# Patient Record
Sex: Male | Born: 1940 | Race: White | Hispanic: No | Marital: Married | Smoking: Former smoker
Health system: Southern US, Community
[De-identification: ages and names within clinical notes are randomized; demographics above are authoritative.]

## PROBLEM LIST (undated history)

## (undated) DIAGNOSIS — D509 Iron deficiency anemia, unspecified: Secondary | ICD-10-CM

## (undated) DIAGNOSIS — I4891 Unspecified atrial fibrillation: Secondary | ICD-10-CM

## (undated) DIAGNOSIS — Q249 Congenital malformation of heart, unspecified: Secondary | ICD-10-CM

## (undated) DIAGNOSIS — E119 Type 2 diabetes mellitus without complications: Secondary | ICD-10-CM

## (undated) DIAGNOSIS — K219 Gastro-esophageal reflux disease without esophagitis: Secondary | ICD-10-CM

## (undated) DIAGNOSIS — I10 Essential (primary) hypertension: Secondary | ICD-10-CM

## (undated) DIAGNOSIS — G4733 Obstructive sleep apnea (adult) (pediatric): Secondary | ICD-10-CM

## (undated) DIAGNOSIS — E785 Hyperlipidemia, unspecified: Secondary | ICD-10-CM

## (undated) DIAGNOSIS — I2510 Atherosclerotic heart disease of native coronary artery without angina pectoris: Secondary | ICD-10-CM

## (undated) DIAGNOSIS — N200 Calculus of kidney: Secondary | ICD-10-CM

## (undated) DIAGNOSIS — I059 Rheumatic mitral valve disease, unspecified: Secondary | ICD-10-CM

## (undated) HISTORY — DX: Atherosclerotic heart disease of native coronary artery without angina pectoris: I25.10

## (undated) HISTORY — DX: Calculus of kidney: N20.0

## (undated) HISTORY — DX: Congenital malformation of heart, unspecified: Q24.9

## (undated) HISTORY — PX: MANDIBLE SURGERY: SHX707

## (undated) HISTORY — DX: Essential (primary) hypertension: I10

## (undated) HISTORY — DX: Gastro-esophageal reflux disease without esophagitis: K21.9

## (undated) HISTORY — DX: Obstructive sleep apnea (adult) (pediatric): G47.33

## (undated) HISTORY — DX: Rheumatic mitral valve disease, unspecified: I05.9

## (undated) HISTORY — DX: Unspecified atrial fibrillation: I48.91

## (undated) HISTORY — DX: Hyperlipidemia, unspecified: E78.5

## (undated) HISTORY — DX: Type 2 diabetes mellitus without complications: E11.9

## (undated) HISTORY — DX: Iron deficiency anemia, unspecified: D50.9

## (undated) HISTORY — PX: ANGIOPLASTY: SHX39

---

## 2000-11-27 ENCOUNTER — Inpatient Hospital Stay (HOSPITAL_COMMUNITY): Admission: EM | Admit: 2000-11-27 | Discharge: 2000-12-03 | Payer: Self-pay | Admitting: Internal Medicine

## 2002-06-29 ENCOUNTER — Encounter: Admission: RE | Admit: 2002-06-29 | Discharge: 2002-06-29 | Payer: Self-pay | Admitting: Cardiology

## 2002-06-29 ENCOUNTER — Encounter: Payer: Self-pay | Admitting: Cardiology

## 2002-07-12 ENCOUNTER — Ambulatory Visit (HOSPITAL_COMMUNITY): Admission: RE | Admit: 2002-07-12 | Discharge: 2002-07-12 | Payer: Self-pay | Admitting: Cardiology

## 2002-09-17 ENCOUNTER — Ambulatory Visit (HOSPITAL_COMMUNITY): Admission: RE | Admit: 2002-09-17 | Discharge: 2002-09-18 | Payer: Self-pay | Admitting: Cardiology

## 2003-01-31 ENCOUNTER — Encounter: Admission: RE | Admit: 2003-01-31 | Discharge: 2003-05-01 | Payer: Self-pay | Admitting: Family Medicine

## 2006-10-27 ENCOUNTER — Encounter: Payer: Self-pay | Admitting: Cardiology

## 2007-04-15 ENCOUNTER — Encounter: Payer: Self-pay | Admitting: Cardiology

## 2008-06-24 ENCOUNTER — Encounter: Payer: Self-pay | Admitting: Cardiology

## 2009-01-15 ENCOUNTER — Encounter: Payer: Self-pay | Admitting: Pulmonary Disease

## 2009-04-12 ENCOUNTER — Encounter: Payer: Self-pay | Admitting: Pulmonary Disease

## 2009-05-05 ENCOUNTER — Encounter: Payer: Self-pay | Admitting: Cardiology

## 2009-10-09 ENCOUNTER — Telehealth (INDEPENDENT_AMBULATORY_CARE_PROVIDER_SITE_OTHER): Payer: Self-pay | Admitting: *Deleted

## 2009-10-11 DIAGNOSIS — D509 Iron deficiency anemia, unspecified: Secondary | ICD-10-CM

## 2009-10-11 DIAGNOSIS — I4891 Unspecified atrial fibrillation: Secondary | ICD-10-CM

## 2009-10-11 DIAGNOSIS — E785 Hyperlipidemia, unspecified: Secondary | ICD-10-CM

## 2009-10-11 DIAGNOSIS — I059 Rheumatic mitral valve disease, unspecified: Secondary | ICD-10-CM

## 2009-10-11 DIAGNOSIS — K219 Gastro-esophageal reflux disease without esophagitis: Secondary | ICD-10-CM | POA: Insufficient documentation

## 2009-10-11 DIAGNOSIS — R55 Syncope and collapse: Secondary | ICD-10-CM | POA: Insufficient documentation

## 2009-10-11 HISTORY — DX: Unspecified atrial fibrillation: I48.91

## 2009-10-11 HISTORY — DX: Iron deficiency anemia, unspecified: D50.9

## 2009-10-11 HISTORY — DX: Hyperlipidemia, unspecified: E78.5

## 2009-10-11 HISTORY — DX: Rheumatic mitral valve disease, unspecified: I05.9

## 2009-10-11 HISTORY — DX: Gastro-esophageal reflux disease without esophagitis: K21.9

## 2009-10-12 ENCOUNTER — Ambulatory Visit: Payer: Self-pay | Admitting: Cardiology

## 2009-10-12 DIAGNOSIS — I2510 Atherosclerotic heart disease of native coronary artery without angina pectoris: Secondary | ICD-10-CM

## 2009-10-12 DIAGNOSIS — I10 Essential (primary) hypertension: Secondary | ICD-10-CM

## 2009-10-12 DIAGNOSIS — R0989 Other specified symptoms and signs involving the circulatory and respiratory systems: Secondary | ICD-10-CM

## 2009-10-12 HISTORY — DX: Atherosclerotic heart disease of native coronary artery without angina pectoris: I25.10

## 2009-10-20 ENCOUNTER — Ambulatory Visit: Payer: Self-pay | Admitting: Pulmonary Disease

## 2009-10-20 ENCOUNTER — Ambulatory Visit: Payer: Self-pay | Admitting: Cardiology

## 2009-10-20 ENCOUNTER — Ambulatory Visit: Payer: Self-pay

## 2009-10-20 DIAGNOSIS — E119 Type 2 diabetes mellitus without complications: Secondary | ICD-10-CM

## 2009-10-20 DIAGNOSIS — I10 Essential (primary) hypertension: Secondary | ICD-10-CM

## 2009-10-20 DIAGNOSIS — G4733 Obstructive sleep apnea (adult) (pediatric): Secondary | ICD-10-CM

## 2009-10-20 HISTORY — DX: Essential (primary) hypertension: I10

## 2009-10-20 HISTORY — DX: Obstructive sleep apnea (adult) (pediatric): G47.33

## 2009-10-20 HISTORY — DX: Type 2 diabetes mellitus without complications: E11.9

## 2009-10-23 ENCOUNTER — Encounter: Payer: Self-pay | Admitting: Cardiology

## 2009-10-23 LAB — CONVERTED CEMR LAB
ALT: 18 units/L (ref 0–53)
AST: 23 units/L (ref 0–37)
Albumin: 4.3 g/dL (ref 3.5–5.2)
Alkaline Phosphatase: 46 units/L (ref 39–117)
BUN: 14 mg/dL (ref 6–23)
Bilirubin, Direct: 0.1 mg/dL (ref 0.0–0.3)
CO2: 30 meq/L (ref 19–32)
Calcium: 9.3 mg/dL (ref 8.4–10.5)
Chloride: 100 meq/L (ref 96–112)
Cholesterol: 136 mg/dL (ref 0–200)
Creatinine, Ser: 0.8 mg/dL (ref 0.4–1.5)
GFR calc non Af Amer: 102.22 mL/min (ref >60)
Glucose, Bld: 142 mg/dL — ABNORMAL HIGH (ref 70–99)
HDL: 45.8 mg/dL (ref >39.00)
LDL Cholesterol: 66 mg/dL (ref 0–99)
Potassium: 4.9 meq/L (ref 3.5–5.1)
Sodium: 138 meq/L (ref 135–145)
Total Bilirubin: 0.7 mg/dL (ref 0.3–1.2)
Total CHOL/HDL Ratio: 3
Total Protein: 6.8 g/dL (ref 6.0–8.3)
Triglycerides: 119 mg/dL (ref 0.0–149.0)
VLDL: 23.8 mg/dL (ref 0.0–40.0)

## 2009-10-27 ENCOUNTER — Ambulatory Visit: Payer: Self-pay | Admitting: Internal Medicine

## 2010-05-16 ENCOUNTER — Encounter: Payer: Self-pay | Admitting: Internal Medicine

## 2010-07-27 ENCOUNTER — Ambulatory Visit: Payer: Self-pay | Admitting: Internal Medicine

## 2010-07-30 LAB — CONVERTED CEMR LAB
ALT: 22 units/L (ref 0–53)
AST: 28 units/L (ref 0–37)
Albumin: 4.5 g/dL (ref 3.5–5.2)
Alkaline Phosphatase: 44 units/L (ref 39–117)
BUN: 17 mg/dL (ref 6–23)
Basophils Absolute: 0 10*3/uL (ref 0.0–0.1)
Basophils Relative: 0.8 % (ref 0.0–3.0)
Bilirubin Urine: NEGATIVE
Bilirubin, Direct: 0.1 mg/dL (ref 0.0–0.3)
CO2: 28 meq/L (ref 19–32)
Calcium: 9.1 mg/dL (ref 8.4–10.5)
Chloride: 102 meq/L (ref 96–112)
Cholesterol: 143 mg/dL (ref 0–200)
Creatinine, Ser: 0.7 mg/dL (ref 0.4–1.5)
Eosinophils Absolute: 0.2 10*3/uL (ref 0.0–0.7)
Eosinophils Relative: 4.1 % (ref 0.0–5.0)
GFR calc non Af Amer: 115.17 mL/min (ref >60)
Glucose, Bld: 135 mg/dL — ABNORMAL HIGH (ref 70–99)
HCT: 41 % (ref 39.0–52.0)
HDL: 44 mg/dL (ref >39.00)
Hemoglobin, Urine: NEGATIVE
Hemoglobin: 14.2 g/dL (ref 13.0–17.0)
Hgb A1c MFr Bld: 7.3 % — ABNORMAL HIGH (ref 4.6–6.5)
Ketones, ur: NEGATIVE mg/dL
LDL Cholesterol: 81 mg/dL (ref 0–99)
Leukocytes, UA: NEGATIVE
Lymphocytes Relative: 24.9 % (ref 12.0–46.0)
Lymphs Abs: 1.2 10*3/uL (ref 0.7–4.0)
MCHC: 34.6 g/dL (ref 30.0–36.0)
MCV: 90.2 fL (ref 78.0–100.0)
Monocytes Absolute: 0.6 10*3/uL (ref 0.1–1.0)
Monocytes Relative: 11.7 % (ref 3.0–12.0)
Neutro Abs: 2.8 10*3/uL (ref 1.4–7.7)
Neutrophils Relative %: 58.5 % (ref 43.0–77.0)
Nitrite: NEGATIVE
PSA: 0.34 ng/mL (ref 0.10–4.00)
Platelets: 132 10*3/uL — ABNORMAL LOW (ref 150.0–400.0)
Potassium: 4 meq/L (ref 3.5–5.1)
RBC: 4.54 M/uL (ref 4.22–5.81)
RDW: 13.2 % (ref 11.5–14.6)
Sodium: 140 meq/L (ref 135–145)
Specific Gravity, Urine: 1.025 (ref 1.000–1.030)
TSH: 1.43 microintl units/mL (ref 0.35–5.50)
Total Bilirubin: 0.8 mg/dL (ref 0.3–1.2)
Total CHOL/HDL Ratio: 3
Total Protein, Urine: NEGATIVE mg/dL
Total Protein: 6.9 g/dL (ref 6.0–8.3)
Triglycerides: 88 mg/dL (ref 0.0–149.0)
Urine Glucose: NEGATIVE mg/dL
Urobilinogen, UA: 0.2 (ref 0.0–1.0)
VLDL: 17.6 mg/dL (ref 0.0–40.0)
WBC: 4.8 10*3/uL (ref 4.5–10.5)
pH: 6 (ref 5.0–8.0)

## 2010-09-11 ENCOUNTER — Encounter: Payer: Self-pay | Admitting: Internal Medicine
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
## 2010-10-09 NOTE — Progress Notes (Signed)
  Faxed ROI over to [facility] to get Records from them [facility]  October 09, 2009 10:40 AM     Appended Document:  Faxed ROI over to Dr.McInnis's office in [place] to fax [phone]/CB [phone].  Appended Document:  Recieved records from Dr.McInnis's office forwarded to [facility] for appt 11/03/2009 w/ Jens Som

## 2010-10-09 NOTE — Letter (Signed)
Summary: [facility] Office Note  [facility] Office Note   Imported By: Roderic Ovens 12/11/2009 12:32:28  _____________________________________________________________________  External Attachment:    Type:   Image     Comment:   External Document
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
## 2010-10-09 NOTE — Letter (Signed)
Summary: [facility] Office Note  [facility] Office Note   Imported By: Roderic Ovens 12/11/2009 12:33:18  _____________________________________________________________________  External Attachment:    Type:   Image     Comment:   External Document

## 2010-10-09 NOTE — Letter (Signed)
Summary: [facility]   Imported By: Lester Wabasso 05/21/2010 09:35:26  _____________________________________________________________________  External Attachment:    Type:   Image     Comment:   External Document

## 2010-10-09 NOTE — Letter (Signed)
Summary: [facility]  [facility], Main Office  [address]   [place], [place] [address]   Phone: [phone]  Fax: [phone]         October 23, 2009 MRN: [id number]   Shane Moreno [address] [place], [place]  [address]   Dear Mr. RADIN,  We have reviewed your cholesterol results.  They are as follows:     Total Cholesterol:    136 (Desirable: less than 200)       HDL  Cholesterol:     45.80  (Desirable: greater than 40 for men and 50 for women)       LDL Cholesterol:       66  (Desirable: less than 100 for low risk and less than 70 for moderate to high risk)       Triglycerides:       119.0  (Desirable: less than 150)  Our recommendations include:  CONTINUE THE SAME   Call our office at the number listed above if you have any questions.  Lowering your LDL cholesterol is important, but it is only one of a large number of "risk factors" that may indicate that you are at risk for heart disease, stroke or other complications of hardening of the arteries.  Other risk factors include:   A.  Cigarette Smoking* B.  High Blood Pressure* C.  Obesity* D.   Low HDL Cholesterol (see yours above)* E.   Diabetes Mellitus (higher risk if your is uncontrolled) F.  Family history of premature heart disease G.  Previous history of stroke or cardiovascular disease    *These are risk factors YOU HAVE CONTROL OVER.  For more information, visit .      There is now evidence that lowering the TOTAL CHOLESTEROL AND LDL CHOLESTEROL can reduce the risk of heart disease.  The American Heart Association recommends the following guidelines for the treatment of elevated cholesterol:  1.  If there is now current heart disease and less than two risk factors, TOTAL CHOLESTEROL should be less than 200 and LDL CHOLESTEROL should be less than 100. 2.  If there is current heart disease or two or more risk factors, TOTAL CHOLESTEROL should be less than 200 and LDL  CHOLESTEROL should be less than 70.  A diet low in cholesterol, saturated fat, and calories is the cornerstone of treatment for elevated cholesterol.  Cessation of smoking and exercise are also important in the management of elevated cholesterol and preventing vascular disease.  Studies have shown that 30 to 60 minutes of physical activity most days can help lower blood pressure, lower cholesterol, and keep your weight at a healthy level.  Drug therapy is used when cholesterol levels do not respond to therapeutic lifestyle changes (smoking cessation, diet, and exercise) and remains unacceptably high.  If medication is started, it is important to have you levels checked periodically to evaluate the need for further treatment options.  Thank you,    [employer] Team

## 2010-10-09 NOTE — Progress Notes (Signed)
Summary: Med List  Med List   Imported By: Roderic Ovens 12/11/2009 12:34:14  _____________________________________________________________________  External Attachment:    Type:   Image     Comment:   External Document

## 2010-10-09 NOTE — Consult Note (Signed)
Summary: [facility]  [facility]   Imported By: Marylou Mccoy 10/24/2009 09:54:00  _____________________________________________________________________  External Attachment:    Type:   Image     Comment:   External Document

## 2010-10-11 NOTE — Letter (Signed)
Summary: Referral - not able to see patient  [facility]  [address] [place], [place] [address]   Phone: [phone]  Fax: [phone]    September 11, 2010  Oliver Barre, MD [address] [place], [place] [address]   Re:   Shane Moreno DOB:  Jul 24, 1941 MRN:   [id number]    Dear Dr. Jonny Ruiz:  Thank you for your kind referral of the above patient.  We have attempted to schedule the recommended procedure Screening Colonoscopy but have not been able to schedule because:  X   The patient was not available by phone and/or has not returned our calls.  ___ The patient declined to schedule the procedure at this time.  We appreciate the referral and hope that we will have the opportunity to treat this patient in the future.    Sincerely,    [facility] [phone]

## 2010-10-18 ENCOUNTER — Encounter: Payer: Self-pay | Admitting: Cardiology

## 2010-10-18 ENCOUNTER — Ambulatory Visit (INDEPENDENT_AMBULATORY_CARE_PROVIDER_SITE_OTHER): Payer: Medicare HMO | Admitting: Cardiology

## 2010-10-18 ENCOUNTER — Telehealth: Payer: Self-pay | Admitting: Internal Medicine

## 2010-10-18 DIAGNOSIS — I2510 Atherosclerotic heart disease of native coronary artery without angina pectoris: Secondary | ICD-10-CM

## 2010-10-18 DIAGNOSIS — I10 Essential (primary) hypertension: Secondary | ICD-10-CM

## 2010-10-18 DIAGNOSIS — E7800 Pure hypercholesterolemia, unspecified: Secondary | ICD-10-CM
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
## 2010-10-25 NOTE — Progress Notes (Signed)
Summary: RX REQUEST  Phone Note Call from Patient Call back at Home Phone [phone]   Caller: Patient Call For: Corwin Levins MD Summary of Call: Pt is requesting refill for METFORMIN HCL 1000MG , TRUETEST TEST STRP AND GLIMEPIRIDE 1 MG TABS [facility] fax # [phone]. Pt also request a phone call from the nurse concern about glucose testing machine, pt stated that the reading is a little too high compare to the other one that he was on which was freestyle. Please advise.  Initial call taken by: Livingston Diones,  October 18, 2010 1:48 PM  Follow-up for Phone Call        Called pt no ansew [facility] RTC Follow-up by: Orlan Leavens RMA,  October 18, 2010 2:13 PM  Additional Follow-up for Phone Call Additional follow up Details #1::        Pt return call back need [facility], also let pt know continue to check BS and if they are continuing to be high will need f/u appt to be evaluated. Additional Follow-up by: Orlan Leavens RMA,  October 18, 2010 3:40 PM    New/Updated Medications: TRUETEST TEST  STRP (GLUCOSE BLOOD) check blood sugars once day Prescriptions: TRUETEST TEST  STRP (GLUCOSE BLOOD) check blood sugars once day  #90 x 3   Entered by:   Orlan Leavens RMA   Authorized by:   Corwin Levins MD   Signed by:   Orlan Leavens RMA on 10/18/2010   Method used:   Faxed to ...       [facility] (mail-order)             , [place]         Ph: [phone]       Fax: [phone]   RxID:   [id number] GLIMEPIRIDE 1 MG TABS (GLIMEPIRIDE) 1/2 by mouth once daily  #45 x 3   Entered by:   Orlan Leavens RMA   Authorized by:   Corwin Levins MD   Signed by:   Orlan Leavens RMA on 10/18/2010   Method used:   Faxed to ...       [facility] (mail-order)             , [place]         Ph: [phone]       Fax: [phone]   RxID:   [id number] METFORMIN HCL 1000 MG TABS (METFORMIN HCL) ONE TABLET BY MOUTH TWICE DAILY  #180 x 3   Entered by:   Orlan Leavens  RMA   Authorized by:   Corwin Levins MD   Signed by:   Orlan Leavens RMA on 10/18/2010   Method used:   Faxed to ...       [facility] (mail-order)             , [place]         Ph: [phone]       Fax: [phone]   RxID:   [id number]

## 2011-01-25 NOTE — Cardiovascular Report (Signed)
NAME:  Shane Moreno, Shane Moreno                        ACCOUNT NO.:  [id number]   MEDICAL RECORD NO.:  [id number]                   PATIENT TYPE:  OIB   LOCATION:  6529                                 FACILITY:  [facility]   PHYSICIAN:  Thereasa Solo. Little, M.D.              DATE OF BIRTH:  Jun 16, 1941   DATE OF PROCEDURE:  09/17/2002  DATE OF DISCHARGE:                              CARDIAC CATHETERIZATION   INDICATIONS FOR TEST:  The patient is a 70 year old male who had atrial  fibrillation.  As part of his workup he had nuclear study that was positive  for inferior ischemia.  He was cardioverted from his atrial fibrillation,  has remained on Coumadin for six weeks following the cardioversion, has had  no recurrent atrial fibrillation.  He is brought in for outpatient cardiac  catheterization.  Because of his abnormal nuclear study the Coumadin was  stopped four days prior to his catheterization.   DESCRIPTION OF PROCEDURE:  The patient was prepped and draped in the usual  sterile fashion exposing the right groin. Following local anesthetic with 1%  Xylocaine, the Seldinger technique was employed and a 5 [place] introducer  sheath was placed into the right femoral artery. Left and right coronary  arteriography and ventriculography in the RAO projection was performed.  Primary stenting to the RCA was also performed.   EQUIPMENT:  The 5 French Judkins configuration catheters and a 5 cm curved  left catheter had to be used to adequately cannulate the left main.  Interventional equipment included a 6 [place] JL4, a short Luge wire, and a  3.5 x 13 Zeta stent.   COMPLICATIONS:  None.   MEDICATIONS:  Angiomax.   RESULTS:  1. Hemodynamic monitoring:  Central aortic pressure was 148/85. Left     ventricular pressure was 148/14 and there was no aortic valve gradient     noted at time of pullback.  2. Ventriculography:  Ventriculography in the RAO projection using 25 cubic     centimeter of contrast  at 12 cubic centimeter per second revealed     decreased filling of the left ventricle with contrast, but there was no     focal wall motion abnormalities.  The ejection fraction was low-normal of     about 50%.  There was mitral valve prolapse with no mitral regurgitation     and the end-diastolic pressure was 20.   CORONARY ARTERIOGRAPHY:  There was calcification of the left main and LAD.  1. Left main:  Normal.  2. LAD:  The LAD extended down to the apex of the heart.  It had 20% ostial     narrowing and an area of 40% narrowing in its proximal/midportion. The     distal vessel was free of disease.  There was a small diagonal branch off     the LAD that was free of disease.  3. Circumflex:  The circumflex was a  large vessel that bifurcated into a     very large OM bed that actually had three branches.  This system was free     of significant disease.  4. Right coronary artery:  The right coronary artery is a very large     dominant vessel giving rise to a large PDA and two big posterolateral     branches.  There was mild irregularities in the RCA with an 80% napkin     ring type lesion in the midportion of the RCA.  Just leading up to the     napkin ring was then irregularities.   CONCLUSIONS:  1. 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  2. Low-normal ejection fraction of 50%.  3. Mitral valve prolapse without mitral regurgitation.   Arrangements were made for urgent intervention.  The 5 French sheath that  had been placed previously had been exchanged for a 6 [place] sheath.  A JR4  guide catheter and a short luge wire was used.  The wire was placed well  distal to the lesion in the ongoing RCA.  A 3.5 x 13 Zeta stent crossed the  lesion with no significant difficulty and after appropriate positioning, was  inflated at 13 atmospheres for 71 seconds with a final inflation being 15  atmospheres for 64 seconds.  The  area that was 80% __________ and that was  normal following stenting.   The longer stent was used because of the proximal irregularities to make  sure that this area was also covered.   I used a Zeta stent rather than a Cypher stent because the patient had been  on Coumadin for atrial fibrillation and I was concerned he may have  recurrent atrial fibrillation.  A coated stent would require much longer  Plavix therapy, and in the event he had a recurrence of his atrial  fibrillation, I wanted to be able to go back to Coumadin. We will plan  aspirin and Plavix for 90 days and then return to Coumadin.                                                 Thereasa Solo. Little, M.D.    ABL/MEDQ  D:  09/17/2002  T:  09/17/2002  Job:  [id number]   cc:   Brett Canales A. Cleta Alberts, M.D.  [address]  [place]  [place] [address]  Fax: [phone]

## 2011-01-25 NOTE — Cardiovascular Report (Signed)
   NAME:  Shane Moreno, Shane Moreno                        ACCOUNT NO.:  [id number]   MEDICAL RECORD NO.:  [id number]                   PATIENT TYPE:  OIB   LOCATION:  NA                                   FACILITY:  [facility]   PHYSICIAN:  Thereasa Solo. Little, M.D.              DATE OF BIRTH:  06/04/1941   DATE OF PROCEDURE:  07/12/2002  DATE OF DISCHARGE:                              CARDIAC CATHETERIZATION   PROCEDURE:  Elective cardioversion.   INDICATIONS FOR PROCEDURE:  The patient is a 70 year old male who has had  atrial flutter of approximately four to six month's duration.  He has been  well anticoagulated and has been on medical therapy but continues to have  atrial flutter with controlled ventricular response.  He is brought in for  elective cardioversion.   PROCEDURAL NOTE:  With anesthesia's assistance, the patient was given 400 mg  of IV Pentothal.  Once an appropriate level of anesthesia was reached, the  patient was initially cardioverted with anterior pads at 100 watt-seconds.  He continued in atrial fibrillation-flutter; 225 watt-seconds resulted in  sinus rhythm at 90 with a blood pressure of 133/70.  He is currently moving  all four extremities to command.   CONCLUSION:  Successful elective cardioversion for atrial fibrillation-  flutter.   The patient will be discharged to home once he has been monitored for  approximately an hour-and-a-half.  I will see him in the office and continue  his outpatient medications.                                               Thereasa Solo. Little, M.D.    ABL/MEDQ  D:  07/12/2002  T:  07/12/2002  Job:  [id number]   cc:   Brett Canales A. Cleta Alberts, M.D.  [address]  [place]  [place] [address]  Fax: [phone]

## 2011-01-25 NOTE — H&P (Signed)
[facility]  Patient:    Shane Moreno, Shane Moreno                     MRN: [id number] Adm. Date:  [id number] Attending:  Avie Echevaria                         History and Physical  CHIEF COMPLAINT:  Syncope.  HISTORY OF PRESENT ILLNESS:  This is a 70 year old white male remote smoker with gradual onset of progressively worsening coughing paroxysms over the last three weeks.  He has had purulent sputum for two weeks but has already received antibiotics with improvement in his purulence and also started on prednisone but still complains of coughing fits that are so severe that he loses consciousness.  Sometimes he gags and vomits.  He has not been able to lie down at all for the last two weeks, sleeping in a recliner now, because of the coughing paroxysms.  Other than when he is coughing, he denies any respiratory distress, chest pain, fever, chills, sweats, orthopnea, PND, or left swelling.  PAST MEDICAL HISTORY:  No major operations.  He denies any major medical illnesses.  MEDICATIONS: 1. Prednisone. 2. Guaifenesin. 3. Prevacid. 4. Multivitamins.  ALLERGIES:  None known including no aspirin intolerance.  SOCIAL HISTORY:  He quit smoking 21 years ago, previously smoking for 38 years at up to four packs a day.  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  He denies any unusual travel, pet, or hobby exposure.  FAMILY HISTORY:  Positive for emphysema in his father, who smoked, and also positive for heart disease in his father.  REVIEW OF SYSTEMS:  Taken in detail on my office work sheet and essentially negative except as noted above except for intermittent rectal bleeding that he attributes to hemorrhoids over the last several years.  PHYSICAL EXAMINATION:  GENERAL:  This is a distraught-appearing, ambulatory white male with several ecchymoses over the face where he says he hit himself  on falling.  HEENT:  He has an upper set of dentures.  Lower dentition is intact. Oropharynx reveals no evidence of excessive posterior nasal drainage or cobblestoning.  The ears canals were clear bilaterally.  NECK:  Supple without cervical adenopathy or tenderness.  Trachea was midline with no thyromegaly.  LUNGS:  Lung fields revealed no cough on inspiratory or expiratory maneuvers.  CARDIAC:  Regular rate and rhythm without murmurs, gallops, or rubs.  No ectopics were noted.  ABDOMEN:  Soft, benign with no palpable organomegaly or masses.  EXTREMITIES:  Warm without calf tenderness, cyanosis, clubbing, or edema.  NEUROLOGIC:  No focal deficits.  Pathologic reflexes.  SKIN:  Warm and dry.  LABORATORY DATA:  Chest x-ray was reviewed from March 12 and March 18 and is normal.  IMPRESSION: 1. This is classic cough syncope with initial onset of cough three weeks ago,    probably consistent with bronchitis.  The problem is now that his cough is    so violent that it is both traumatizing his airway and also causing either    a vasovagal syncope or direct compression of the venous return dropping    cardiac output to critical levels.  He has clearly passed out multiple    times by the ecchymoses he has on his face and is at risk of hurting    himself further if we do not get  the cough suppressed immediately.  I    therefore recommended admitting him to the hospital for inhaled albuterol    with lidocaine, IV steroids, and cough suppression. 2. In reviewing the lab data from Dr. Juline Patch office, I note that he has a    microcytic anemia.  He states he has a history of hemorrhoids with    intermittent rectal bleeding.  This may aggravate the effect of cough, in    terms of diminishing cardiac output to a critical level.  It may need to be    addressed at this admission.  I am concerned about the possibility that it    may represent more than just hemorrhoidal bleeding but he is  obviously in    no condition for an elective GI workup at this point. DD:  11/27/00 TD:  11/28/00 Job: [id number]

## 2011-01-25 NOTE — Discharge Summary (Signed)
[facility]  Patient:    Shane Moreno, Shane Moreno                     MRN: [id number] Adm. Date:  [id number] Disc. Date: [id number] Attending:  Avie Echevaria                           Discharge Summary  FINAL DIAGNOSES: 1. Severe coughing paroxysms complicated by repeated cough syncope    without significant correlating cardiac arrhythmias. 2. Iron deficiency anemia with a history of hemorrhoidal bleeding with iron    saturation of 7% and ferritin level of 8.  Started on iron this admission. 3. Probable gastroesophageal reflux disease. 4. Questionable functional aspect suggesting a cyclical mechanism (see    discussion below). 5. Mild hyperlipidemia with low-density lipoprotein cholesterol 138 this    admission.  HISTORY OF PRESENT ILLNESS:  Please see dictated H&P.  This is a 70 year old white male, remote smoker, with gradual onset of progressively worsening cough over the last three weeks prior to admission having already failed outpatient therapy with Zithromax and prednisone when I saw him in the office.  He was falling repeatedly after syncopal spells striking his head and face.  He was therefore admitted to the hospital and placed on cardiac monitor.  HOSPITAL COURSE:  He had a work-up that included a sinus CT scan indicating only mild mucosal thickening with a normal chest x-ray and no obvious explanation for cough other than suspected GERD and a cyclical mechanism where the harder he coughs, the more airway inflammation develops leading to heightened airway sensitivity and further coughing.  Despite repeatedly explaining the mechanism to the patient, he showed very little insight into his condition, clearing his throat constantly, not using lozenges as recommended and overusing his voice.  However, with very heavy cough suppression in form of codeine around the clock, Solu-Medrol for suppression of airway inflammation and albuterol with  lidocaine, the cough was eventually brought under control and will be discharged today in improved condition with no further cough syncope the last 24 hours.  DISCHARGE MEDICATIONS: 1. Humibid DM 2 b.i.d. 2. Protonix 40 mg b.i.d. empirically. 3. Nu-Iron 151 daily. 4. Mepergan 40 one to two q.4h. p.r.n. cough. 5. Prednisone 40 mg per day tapered off over eight days.  FOLLOWUP:  Follow up in the office in two weeks, sooner if needed.  SPECIAL INSTRUCTIONS:  He was advised against driving a car for the next two weeks.  His wife was also instructed on should coughing fit occur again, he should be placed supine immediately preventing from falling. DD:  12/03/00 TD:  12/04/00 Job: [id number]

## 2011-10-12 ENCOUNTER — Encounter: Payer: Self-pay | Admitting: Internal Medicine

## 2011-10-12 DIAGNOSIS — Z0001 Encounter for general adult medical examination with abnormal findings: Secondary | ICD-10-CM | POA: Insufficient documentation

## 2011-10-12 DIAGNOSIS — Z0000 Encounter for general adult medical examination without abnormal findings: Secondary | ICD-10-CM | POA: Insufficient documentation

## 2011-10-18 ENCOUNTER — Ambulatory Visit (INDEPENDENT_AMBULATORY_CARE_PROVIDER_SITE_OTHER): Payer: Medicare HMO | Admitting: Internal Medicine

## 2011-10-18 ENCOUNTER — Encounter: Payer: Self-pay | Admitting: Internal Medicine

## 2011-10-18 ENCOUNTER — Other Ambulatory Visit (INDEPENDENT_AMBULATORY_CARE_PROVIDER_SITE_OTHER): Payer: Medicare HMO

## 2011-10-18 VITALS — BP 130/72 | HR 60 | Temp 97.0°F | Ht 74.0 in | Wt 247.5 lb

## 2011-10-18 DIAGNOSIS — E119 Type 2 diabetes mellitus without complications: Secondary | ICD-10-CM

## 2011-10-18 DIAGNOSIS — Z0000 Encounter for general adult medical examination without abnormal findings: Secondary | ICD-10-CM

## 2011-10-18 DIAGNOSIS — Z125 Encounter for screening for malignant neoplasm of prostate: Secondary | ICD-10-CM

## 2011-10-18 LAB — CBC WITH DIFFERENTIAL/PLATELET
Basophils Relative: 0.7 % (ref 0.0–3.0)
Eosinophils Relative: 4.3 % (ref 0.0–5.0)
Lymphocytes Relative: 24.1 % (ref 12.0–46.0)
MCV: 88.5 fl (ref 78.0–100.0)
Monocytes Absolute: 0.5 10*3/uL (ref 0.1–1.0)
Neutrophils Relative %: 62.3 % (ref 43.0–77.0)
RBC: 4.54 Mil/uL (ref 4.22–5.81)
WBC: 5.8 10*3/uL (ref 4.5–10.5)

## 2011-10-18 LAB — LIPID PANEL
Cholesterol: 156 mg/dL (ref 0–200)
HDL: 42.2 mg/dL (ref >39.00)
LDL Cholesterol: 90 mg/dL (ref 0–99)
VLDL: 23.4 mg/dL (ref 0.0–40.0)

## 2011-10-18 LAB — BASIC METABOLIC PANEL
BUN: 21 mg/dL (ref 6–23)
Chloride: 103 mEq/L (ref 96–112)
Glucose, Bld: 134 mg/dL — ABNORMAL HIGH (ref 70–99)
Potassium: 4.7 mEq/L (ref 3.5–5.1)
Sodium: 139 mEq/L (ref 135–145)

## 2011-10-18 LAB — HEPATIC FUNCTION PANEL
ALT: 20 U/L (ref 0–53)
AST: 20 U/L (ref 0–37)
Alkaline Phosphatase: 45 U/L (ref 39–117)
Bilirubin, Direct: 0.1 mg/dL (ref 0.0–0.3)
Total Protein: 7 g/dL (ref 6.0–8.3)

## 2011-10-18 LAB — URINALYSIS, ROUTINE W REFLEX MICROSCOPIC
Bilirubin Urine: NEGATIVE
Nitrite: NEGATIVE
Specific Gravity, Urine: 1.025 (ref 1.000–1.030)
Urine Glucose: NEGATIVE

## 2011-10-18 LAB — PSA: PSA: 0.34 ng/mL (ref 0.10–4.00)

## 2011-10-18 LAB — MICROALBUMIN / CREATININE URINE RATIO
Creatinine,U: 117.5 mg/dL
Microalb Creat Ratio: 6.3 mg/g (ref 0.0–30.0)

## 2011-10-18 MED ORDER — METFORMIN HCL 1000 MG PO TABS: 1000.0000 mg | ORAL_TABLET | Freq: Two times a day (BID) | ORAL | Status: DC

## 2011-10-18 MED ORDER — METOPROLOL TARTRATE 25 MG PO TABS: 25.0000 mg | ORAL_TABLET | Freq: Every day | ORAL | Status: DC

## 2011-10-18 MED ORDER — ENALAPRIL MALEATE 5 MG PO TABS: 5.0000 mg | ORAL_TABLET | Freq: Every day | ORAL | Status: DC

## 2011-10-18 MED ORDER — FREESTYLE LANCETS MISC: Status: DC

## 2011-10-18 MED ORDER — SIMVASTATIN 40 MG PO TABS: 40.0000 mg | ORAL_TABLET | Freq: Every evening | ORAL | Status: DC

## 2011-10-18 MED ORDER — GLUCOSE BLOOD VI STRP: ORAL_STRIP | Status: DC

## 2011-10-18 MED ORDER — GLIMEPIRIDE 1 MG PO TABS: ORAL_TABLET | ORAL | Status: DC

## 2011-10-18 NOTE — Patient Instructions (Signed)
You will be contacted regarding the referral for: colonoscopy Please go to LAB in the Basement for the blood and/or urine tests to be done today Please call the phone number [phone] (the PhoneTree System) for results of testing in 2-3 days;  When calling, simply dial the number, and when prompted enter the MRN number above (the Medical Record Number) and the # key, then the message should start. Please keep your appointments with your specialists as you have planned - Dr Jens Som next month Your refills were sent today to [facility] Please return in 1 year for your yearly visit, or sooner if needed, with Lab testing done 3-5 days before

## 2011-10-19 ENCOUNTER — Other Ambulatory Visit: Payer: Self-pay | Admitting: Internal Medicine

## 2011-10-19 ENCOUNTER — Encounter: Payer: Self-pay | Admitting: Internal Medicine

## 2011-10-19 MED ORDER — GLIMEPIRIDE 1 MG PO TABS: ORAL_TABLET | ORAL | Status: DC

## 2011-10-19 NOTE — Assessment & Plan Note (Signed)
stable overall by hx and exam, most recent data reviewed with pt, and pt to continue medical treatment as before  Lab Results  Component Value Date   HGBA1C 7.4* 10/18/2011

## 2011-10-19 NOTE — Assessment & Plan Note (Signed)
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
## 2011-11-01 ENCOUNTER — Telehealth: Payer: Self-pay | Admitting: Internal Medicine

## 2011-11-01 NOTE — Telephone Encounter (Signed)
To robin to help 

## 2011-11-01 NOTE — Telephone Encounter (Signed)
Pt states [facility] has been trying to contact us about his refills

## 2011-11-04 NOTE — Telephone Encounter (Signed)
Called left message to call back 

## 2011-11-05 NOTE — Telephone Encounter (Signed)
Patient's prescriptions are taken care of.

## 2011-11-15 ENCOUNTER — Encounter: Payer: Self-pay | Admitting: Cardiology

## 2011-11-15 ENCOUNTER — Ambulatory Visit (INDEPENDENT_AMBULATORY_CARE_PROVIDER_SITE_OTHER): Payer: Medicare HMO | Admitting: Cardiology

## 2011-11-15 VITALS — BP 133/71 | HR 67 | Ht 75.0 in | Wt 247.0 lb

## 2011-11-15 DIAGNOSIS — I2510 Atherosclerotic heart disease of native coronary artery without angina pectoris: Secondary | ICD-10-CM

## 2011-11-15 DIAGNOSIS — I10 Essential (primary) hypertension: Secondary | ICD-10-CM

## 2011-11-15 DIAGNOSIS — I4891 Unspecified atrial fibrillation: Secondary | ICD-10-CM

## 2011-11-15 DIAGNOSIS — E785 Hyperlipidemia, unspecified: Secondary | ICD-10-CM

## 2011-11-15 NOTE — Assessment & Plan Note (Signed)
Blood pressure controlled. Continue present medications. Potassium and renal function monitored by primary care. 

## 2011-11-15 NOTE — Assessment & Plan Note (Signed)
Continue statin. Lipids and liver monitored by primary care. 

## 2011-11-15 NOTE — Assessment & Plan Note (Signed)
Continue aspirin and statin. I would like to proceed with a functional study for risk stratification. He has some pain in his right knee and would prefer to avoid Lexiscan. Proceed with stress Myoview when his knee improves. He stated he would contact us.

## 2011-11-15 NOTE — Patient Instructions (Signed)
Your physician wants you to follow-up in: ONE YEAR You will receive a reminder letter in the mail two months in advance. If you don't receive a letter, please call our office to schedule the follow-up appointment.   Your physician has requested that you have en exercise stress myoview. For further information please visit [email or web address] Please follow instruction sheet, as given. CALL TO SCHEDULE

## 2011-11-15 NOTE — Assessment & Plan Note (Signed)
1 isolated episode in 2004. 
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
## 2012-01-13 ENCOUNTER — Encounter: Payer: Self-pay | Admitting: Gastroenterology

## 2012-10-02 ENCOUNTER — Other Ambulatory Visit: Payer: Self-pay

## 2012-10-02 MED ORDER — GLUCOSE BLOOD VI STRP: ORAL_STRIP | Status: DC

## 2012-10-02 MED ORDER — SIMVASTATIN 40 MG PO TABS: 40.0000 mg | ORAL_TABLET | Freq: Every evening | ORAL | Status: DC

## 2012-10-02 MED ORDER — METOPROLOL TARTRATE 25 MG PO TABS: 25.0000 mg | ORAL_TABLET | Freq: Every day | ORAL | Status: DC

## 2012-10-02 MED ORDER — GLIMEPIRIDE 1 MG PO TABS: ORAL_TABLET | ORAL | Status: DC

## 2012-10-02 MED ORDER — ENALAPRIL MALEATE 5 MG PO TABS: 5.0000 mg | ORAL_TABLET | Freq: Every day | ORAL | Status: DC

## 2012-10-02 MED ORDER — METFORMIN HCL 1000 MG PO TABS: 1000.0000 mg | ORAL_TABLET | Freq: Two times a day (BID) | ORAL | Status: DC

## 2012-11-11 ENCOUNTER — Encounter: Payer: Self-pay | Admitting: Pulmonary Disease

## 2012-11-11 ENCOUNTER — Ambulatory Visit (INDEPENDENT_AMBULATORY_CARE_PROVIDER_SITE_OTHER): Payer: Medicare HMO | Admitting: Pulmonary Disease

## 2012-11-11 VITALS — BP 110/72 | HR 58 | Temp 97.7°F | Ht 74.0 in | Wt 248.0 lb

## 2012-11-11 DIAGNOSIS — G4733 Obstructive sleep apnea (adult) (pediatric): Secondary | ICD-10-CM

## 2012-11-11 NOTE — Patient Instructions (Addendum)
Continue on cpap, and work aggressively on weight loss. Will get you referred to a new DME to get supplies and new mask followup with me in one year if doing well.

## 2012-11-11 NOTE — Assessment & Plan Note (Signed)
The patient has not been seen since 2011, but has remained compliant with his CPAP and is doing well.  He is overdue for new supplies and a mask, and we'll get him referred to a new medical equipment company.  I have encouraged him to work aggressively on weight loss, and to call me if he has any issues with his device.  He will need to see me on a yearly basis.

## 2012-11-11 NOTE — Progress Notes (Signed)
Subjective:    Patient ID: Shane Moreno, male    DOB: [birth year], 72 y.o.   MRN: [id number]  HPI The patient is a 72 year old male who I have been asked to see for management of obstructive sleep apnea.  I have seen him in the past for the same, but he has not followed up since 2011.  He has a history of mild obstructive sleep apnea, and has been on CPAP since that time.  He feels that he is doing well with the device, and that his machine is working well.  However, he is well overdue for a new mask and supplies.  The patient feels rested in the mornings upon arising, and his spouse has not heard breakthrough snoring.  He has excellent daytime alertness, and no sleepiness with driving.  He states that his weight is down about 15 pounds over the last one month.  His Epworth score today is normal at 4.  Sleep Questionnaire What time do you typically go to bed?( Between what hours) 10-11pm 10-11pm at 1510 on 11/11/12 by Caryl Ada, CMA How long does it take you to fall asleep? 5-10 minutes 5-10 minutes at 1510 on 11/11/12 by Caryl Ada, CMA How many times during the night do you wake up? 2 2 at 1510 on 11/11/12 by Caryl Ada, CMA What time do you get out of bed to start your day? 0530 0530 at 1510 on 11/11/12 by Caryl Ada, CMA Do you drive or operate heavy machinery in your occupation? No No at 1510 on 11/11/12 by Caryl Ada, CMA How much has your weight changed (up or down) over the past two years? (In pounds) 15 lb (6.804 kg) 15 lb (6.804 kg) at 1510 on 11/11/12 by Caryl Ada, CMA Have you ever had a sleep study before? Yes Yes at 1510 on 11/11/12 by Caryl Ada, CMA If yes, location of study? Dr. Yehuda Budd Dr. Yehuda Budd at 1510 on 11/11/12 by Caryl Ada, CMA If yes, date of study? 5-6 years ago 5-6 years ago at 1510 on 11/11/12 by Caryl Ada, CMA Do you currently use CPAP? Yes Yes at 1510 on 11/11/12 by Caryl Ada, CMA If so, what pressure? 8 8 at 1510 on 11/11/12 by Caryl Ada, CMA Do you wear oxygen at any time? No No at 1510 on 11/11/12 by Caryl Ada, CMA    Review of Systems  Constitutional: Negative for fever and unexpected weight change.  HENT: Negative for ear pain, nosebleeds, congestion, sore throat, rhinorrhea, sneezing, trouble swallowing, dental problem, postnasal drip and sinus pressure.   Eyes: Negative for redness and itching.  Respiratory: Negative for cough, chest tightness, shortness of breath and wheezing.   Cardiovascular: Negative for palpitations and leg swelling.  Gastrointestinal: Positive for nausea and vomiting.  Genitourinary: Negative for dysuria.  Musculoskeletal: Negative for joint swelling.  Skin: Negative for rash.  Neurological: Negative for headaches.  Hematological: Does not bruise/bleed easily.  Psychiatric/Behavioral: Negative for dysphoric mood. The patient is not nervous/anxious.        Objective:   Physical Exam Constitutional:  Well developed, no acute distress  HENT:  Nares patent without discharge, but deviated septum to left with narrowing.  Oropharynx without exudate, palate and uvula are elongated.  Eyes:  Perrla, eomi, no scleral icterus  Neck:  No JVD, no TMG  Cardiovascular:  Normal rate, regular rhythm, no rubs or gallops.  No murmurs  Intact distal pulses  Pulmonary :  Normal breath sounds, no stridor or respiratory distress   No rales, rhonchi, or wheezing  Abdominal:  Soft, nondistended, bowel sounds present.  No tenderness noted.   Musculoskeletal:  minimal lower extremity edema noted.  Lymph Nodes:  No cervical lymphadenopathy noted  Skin:  No cyanosis noted  Neurologic:  Alert, appropriate, moves all 4 extremities without obvious deficit.  Does not appear sleepy.          Assessment & Plan:

## 2012-12-14 ENCOUNTER — Encounter: Payer: Self-pay | Admitting: Cardiology

## 2012-12-14 ENCOUNTER — Ambulatory Visit (INDEPENDENT_AMBULATORY_CARE_PROVIDER_SITE_OTHER): Payer: Medicare HMO | Admitting: Cardiology

## 2012-12-14 VITALS — BP 124/64 | HR 61 | Ht 74.0 in | Wt 250.0 lb

## 2012-12-14 DIAGNOSIS — I4891 Unspecified atrial fibrillation: Secondary | ICD-10-CM

## 2012-12-14 DIAGNOSIS — I10 Essential (primary) hypertension: Secondary | ICD-10-CM

## 2012-12-14 DIAGNOSIS — E785 Hyperlipidemia, unspecified: Secondary | ICD-10-CM

## 2012-12-14 DIAGNOSIS — I2510 Atherosclerotic heart disease of native coronary artery without angina pectoris: Secondary | ICD-10-CM
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
## 2012-12-14 NOTE — Assessment & Plan Note (Signed)
1 isolated episode in 2004.

## 2012-12-14 NOTE — Patient Instructions (Addendum)
Your physician wants you to follow-up in: ONE YEAR WITH DR CRENSHAW You will receive a reminder letter in the mail two months in advance. If you don't receive a letter, please call our office to schedule the follow-up appointment.  

## 2012-12-14 NOTE — Assessment & Plan Note (Signed)
Continue aspirin and statin. We discussed a functional study for risk stratification. However he declined at this point.

## 2012-12-14 NOTE — Assessment & Plan Note (Signed)
Continue statin. Lipids and liver monitored by primary care. 

## 2012-12-14 NOTE — Assessment & Plan Note (Signed)
Blood pressure controlled. Continue present medications. Potassium and renal function monitored by primary care. 

## 2012-12-16 ENCOUNTER — Telehealth: Payer: Self-pay | Admitting: Pulmonary Disease

## 2012-12-16 ENCOUNTER — Other Ambulatory Visit (INDEPENDENT_AMBULATORY_CARE_PROVIDER_SITE_OTHER): Payer: Medicare HMO

## 2012-12-16 ENCOUNTER — Encounter: Payer: Self-pay | Admitting: Internal Medicine

## 2012-12-16 ENCOUNTER — Ambulatory Visit (INDEPENDENT_AMBULATORY_CARE_PROVIDER_SITE_OTHER): Payer: Medicare HMO | Admitting: Internal Medicine

## 2012-12-16 VITALS — BP 110/62 | HR 55 | Temp 97.5°F | Ht 74.0 in | Wt 249.4 lb

## 2012-12-16 DIAGNOSIS — Z0000 Encounter for general adult medical examination without abnormal findings: Secondary | ICD-10-CM

## 2012-12-16 DIAGNOSIS — E119 Type 2 diabetes mellitus without complications: Secondary | ICD-10-CM

## 2012-12-16 DIAGNOSIS — R3915 Urgency of urination: Secondary | ICD-10-CM

## 2012-12-16 DIAGNOSIS — N529 Male erectile dysfunction, unspecified: Secondary | ICD-10-CM

## 2012-12-16 LAB — HEPATIC FUNCTION PANEL
ALT: 23 U/L (ref 0–53)
AST: 21 U/L (ref 0–37)
Albumin: 4.4 g/dL (ref 3.5–5.2)
Alkaline Phosphatase: 49 U/L (ref 39–117)

## 2012-12-16 LAB — BASIC METABOLIC PANEL
BUN: 23 mg/dL (ref 6–23)
GFR: 105.85 mL/min (ref >60.00)
Glucose, Bld: 119 mg/dL — ABNORMAL HIGH (ref 70–99)

## 2012-12-16 LAB — CBC WITH DIFFERENTIAL/PLATELET
Basophils Relative: 0.8 % (ref 0.0–3.0)
Eosinophils Relative: 2.7 % (ref 0.0–5.0)
HCT: 39.9 % (ref 39.0–52.0)
Hemoglobin: 14 g/dL (ref 13.0–17.0)
Lymphs Abs: 1.7 10*3/uL (ref 0.7–4.0)
MCV: 92.3 fl (ref 78.0–100.0)
Monocytes Absolute: 0.7 10*3/uL (ref 0.1–1.0)
Neutro Abs: 4.4 10*3/uL (ref 1.4–7.7)
Platelets: 145 10*3/uL — ABNORMAL LOW (ref 150.0–400.0)
RBC: 4.32 Mil/uL (ref 4.22–5.81)
WBC: 6.9 10*3/uL (ref 4.5–10.5)

## 2012-12-16 LAB — LIPID PANEL
HDL: 39.8 mg/dL (ref >39.00)
LDL Cholesterol: 86 mg/dL (ref 0–99)
Total CHOL/HDL Ratio: 4
Triglycerides: 189 mg/dL — ABNORMAL HIGH (ref 0.0–149.0)

## 2012-12-16 LAB — URINALYSIS, ROUTINE W REFLEX MICROSCOPIC
Ketones, ur: NEGATIVE
Specific Gravity, Urine: 1.025 (ref 1.000–1.030)
Total Protein, Urine: NEGATIVE
Urine Glucose: NEGATIVE
pH: 5.5 (ref 5.0–8.0)

## 2012-12-16 MED ORDER — TADALAFIL 5 MG PO TABS: 5.0000 mg | ORAL_TABLET | Freq: Every day | ORAL | Status: DC | PRN

## 2012-12-16 NOTE — Assessment & Plan Note (Signed)
stable overall by history and exam, recent data reviewed with pt, and pt to continue medical treatment as before,  to f/u any worsening symptoms or concerns Lab Results  Component Value Date   HGBA1C 7.4* 12/16/2012

## 2012-12-16 NOTE — Assessment & Plan Note (Signed)

## 2012-12-16 NOTE — Patient Instructions (Addendum)
Please take all new medication as prescribed  - the cialis Please call for urology referral if the cialis does not seem to help the possible prostate issue Please continue all other medications as before Please have the pharmacy call with any other refills you may need. Please continue your efforts at being more active, low cholesterol diet, and weight control. You are otherwise up to date with prevention measures today. Please call if you change your mind about the colonoscopy Please go to the LAB in the Basement (turn left off the elevator) for the tests to be done today You will be contacted by phone if any changes need to be made immediately.  Otherwise, you will receive a letter about your results with an explanation, Please remember to sign up for My Chart if you have not done so, as this will be important to you in the future with finding out test results, communicating by private email, and scheduling acute appointments online when needed. Please return in 6 months, or sooner if needed, with Lab testing done 3-5 days before

## 2012-12-16 NOTE — Assessment & Plan Note (Signed)
For cialis prn 
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
## 2012-12-16 NOTE — Telephone Encounter (Signed)
lmomtcb x1 for pt--it was sent to [facility]. We will call them in the AM to check on this

## 2012-12-16 NOTE — Assessment & Plan Note (Signed)
For cialis daily 30 day trial,

## 2012-12-17 ENCOUNTER — Telehealth: Payer: Self-pay | Admitting: Pulmonary Disease

## 2012-12-17 NOTE — Telephone Encounter (Signed)
This is CMN and these go to [facility]. I called and made [facility] aware of this and as soon as KC signs this we will fax back. Nothing further was needed

## 2012-12-17 NOTE — Telephone Encounter (Signed)
LMTCB for pt 

## 2012-12-18 NOTE — Telephone Encounter (Signed)
Spoke with patient, aware of Dr. Lenard Lance note. Aware we will follow up with this on Monday

## 2012-12-18 NOTE — Telephone Encounter (Signed)
Patient calling asking about the status of his cpap supplies.

## 2012-12-18 NOTE — Telephone Encounter (Signed)
Pt called and stated that he has been waiting for over  1 month for his cpap supplies and has been told by Christoper Allegra that they have sent over 2 forms to be signed and faxed back by [facility] and they still have not received either of these forms.  i checked with ashtyn and she did find this form in her papers from today.  Form has been placed on KC desk to sign.  Pt was very rude on the phone and stated that if our office and KC did not want to take care of him then he will find another doctor to see.  i advised the pt that we are here to take care of patients and that we would let [facility] know of how unhappy he is.  Pt voiced his understanding of this.   No Known Allergies

## 2012-12-18 NOTE — Telephone Encounter (Signed)
I Called [facility] and was told they needed and order for this pt. I advised that one was sent on 11-11-12 and why is this not enough. She reviewed pt chart and states she does see the order from 11-11-12 and this should be all that is needed so she is not sure what the issue is. She will forward this to the CPAP team to process. [id number] to advise the pt. Carron Curie, CMA

## 2012-12-18 NOTE — Telephone Encounter (Signed)
Let pt know that we did not have this form until today. This aside, I should be only filling out one form a year called a CMN, rather than getting multiple forms.  This is an issue with his DME.

## 2012-12-18 NOTE — Telephone Encounter (Signed)
Returning call can be reached at [phone] until 2p.Raylene Everts

## 2012-12-21 NOTE — Telephone Encounter (Signed)
It has indeed been signed and hopefully will be faxed.

## 2012-12-21 NOTE — Telephone Encounter (Signed)
Form has been faxed to [place]-- [phone] Will call and verify this has been received tomorrow.

## 2012-12-21 NOTE — Telephone Encounter (Signed)
ATC line busy 

## 2012-12-21 NOTE — Telephone Encounter (Signed)
Ashtyn, have these forms been faxed? Carron Curie, CMA

## 2012-12-21 NOTE — Telephone Encounter (Signed)
Dr Shelle Iron, Please verify if you still have this form. Patient is stating and DME is stating that they never received the previous CMN that you signed and faxed back. This is a replacement form that needs to be signed and faxed so that patient may get supplies. Thanks.

## 2012-12-22 NOTE — Telephone Encounter (Signed)
Called and spoke with rep...they are having trouble with receiving faxes. She stated to call back and Recheck later.

## 2013-02-16 ENCOUNTER — Telehealth: Payer: Self-pay | Admitting: Internal Medicine

## 2013-02-16 ENCOUNTER — Telehealth: Payer: Self-pay | Admitting: Pulmonary Disease

## 2013-02-16 NOTE — Telephone Encounter (Signed)
I spoke with the pt and he states that he currently has a full face mask and that his wife complains that it is too noisy and it keeps her awake so he is wondering if he can change to a nasal cannula mask. Please advise. Carron Curie, CMA

## 2013-02-16 NOTE — Telephone Encounter (Signed)
He can try only if he can keep his mouth shut while wearing. A full face mask should not make this noise if fitted right.  ?to sleep center for a mask fitting during the day if pt is interested.

## 2013-02-16 NOTE — Telephone Encounter (Signed)
Patient is needing a rx wrote out but is not sure which one.  Can you please give him a call in this regards.  Thanks!

## 2013-02-16 NOTE — Telephone Encounter (Signed)
Called the patient back on number in phone note, no answer and unable to leave a message.

## 2013-02-16 NOTE — Telephone Encounter (Signed)
If you call patient today you can reach him on his cell at [phone]

## 2013-02-17 NOTE — Telephone Encounter (Signed)
Called spoke with Mrs Orona and advised of KC's recs as stated below about pt may try the nasal cannula mask only if he can keep his mouth at night while wearing and that a full-face mask should not make that noise if it's fitted right.  Advised that we would be happy to set pt up for a mask fitting thru the sleep center if interested.  Mrs Timson stated that she will discuss this with the patient and have him call us back.  Asked spouse if she would like for [place] to call back this week to check on this status of this but she stated that she will "just have him call back once he's made his decision."  Pt spouse aware that pt's mask needs to fit correctly in order for the CPAP to be the most benefit.  She will have pt call back with his decision.  Will sign and forward to [facility] as FYI.

## 2013-02-17 NOTE — Telephone Encounter (Signed)
Called the patient several times on number in phone note.  No answer and no vm to leave message will close note at this time.

## 2013-02-23 ENCOUNTER — Telehealth: Payer: Self-pay | Admitting: Internal Medicine

## 2013-02-23 MED ORDER — TADALAFIL 5 MG PO TABS: 5.0000 mg | ORAL_TABLET | Freq: Every day | ORAL | Status: DC | PRN

## 2013-02-23 NOTE — Telephone Encounter (Signed)
Patient came back in about meds.  Please see previous phone note on 6/10.  Can Call at home number this time.  Thanks!

## 2013-02-23 NOTE — Telephone Encounter (Signed)
The patient requested cialis sent to [facility] on [address]. Previous sent to wrong [facility]

## 2013-02-23 NOTE — Telephone Encounter (Signed)
Called left message to call back 

## 2013-05-06 ENCOUNTER — Telehealth: Payer: Self-pay | Admitting: Pulmonary Disease

## 2013-05-06 DIAGNOSIS — G4733 Obstructive sleep apnea (adult) (pediatric): Secondary | ICD-10-CM

## 2013-05-06 NOTE — Telephone Encounter (Signed)
Called and spoke with pt and he stated that he uses the full face mask now and this makes lots of noise  and pt would like to try the nasal pillars.  KC please advise if this order is ok to send in to [facility] for the pt.  Thanks  No Known Allergies

## 2013-05-07 NOTE — Telephone Encounter (Signed)
I spoke with pt and is aware . Order sent.

## 2013-05-07 NOTE — Telephone Encounter (Signed)
Ok with me, but the only reason a full face mask makes noise is because it is not fitted right or the cushion needs to be changed.

## 2013-06-17 ENCOUNTER — Telehealth: Payer: Self-pay | Admitting: Pulmonary Disease

## 2013-06-17 ENCOUNTER — Ambulatory Visit (INDEPENDENT_AMBULATORY_CARE_PROVIDER_SITE_OTHER): Payer: Medicare HMO | Admitting: Internal Medicine

## 2013-06-17 ENCOUNTER — Encounter: Payer: Self-pay | Admitting: Internal Medicine

## 2013-06-17 VITALS — BP 118/68 | HR 71 | Temp 97.0°F | Ht 74.0 in | Wt 251.5 lb

## 2013-06-17 DIAGNOSIS — E785 Hyperlipidemia, unspecified: Secondary | ICD-10-CM

## 2013-06-17 DIAGNOSIS — Z23 Encounter for immunization: Secondary | ICD-10-CM

## 2013-06-17 DIAGNOSIS — Z0000 Encounter for general adult medical examination without abnormal findings: Secondary | ICD-10-CM

## 2013-06-17 DIAGNOSIS — E119 Type 2 diabetes mellitus without complications: Secondary | ICD-10-CM

## 2013-06-17 DIAGNOSIS — I10 Essential (primary) hypertension: Secondary | ICD-10-CM

## 2013-06-17 DIAGNOSIS — G4733 Obstructive sleep apnea (adult) (pediatric): Secondary | ICD-10-CM

## 2013-06-17 MED ORDER — GLIPIZIDE ER 2.5 MG PO TB24: 2.5000 mg | ORAL_TABLET | Freq: Every day | ORAL | Status: DC

## 2013-06-17 NOTE — Assessment & Plan Note (Signed)
stable overall by history and exam, recent data reviewed with pt, and pt to continue medical treatment as before,  to f/u any worsening symptoms or concerns Lab Results  Component Value Date   LDLCALC 86 12/16/2012
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
## 2013-06-17 NOTE — Assessment & Plan Note (Signed)
stable overall by history and exam, recent data reviewed with pt, and pt to continue medical treatment as before,  to f/u any worsening symptoms or concerns Lab Results  Component Value Date   HGBA1C 7.4* 12/16/2012

## 2013-06-17 NOTE — Assessment & Plan Note (Signed)
stable overall by history and exam, recent data reviewed with pt, and pt to continue medical treatment as before,  to f/u any worsening symptoms or concerns BP Readings from Last 3 Encounters:  06/17/13 118/68  12/16/12 110/62  12/14/12 124/64

## 2013-06-17 NOTE — Patient Instructions (Addendum)
You had the flu shot today, and please return in 2 wks for the Prevnar pneumonia short with a Nurse Visit OK to stop the glimeparide 1/2 pill twice per day Please take all new medication as prescribed  - the glipizide ER 2.5 mg Please continue all other medications as before, and refills have been done if requested. Please have the pharmacy call with any other refills you may need. Please call if you need the Cialis refill again  We should hold on blood work today since we are changing your medication  Please return in 6 months, or sooner if needed, with Lab testing done 3-5 days before

## 2013-06-17 NOTE — Telephone Encounter (Signed)
Ashtyn speaking with pt now Will forward to her to document  Thanks

## 2013-06-17 NOTE — Telephone Encounter (Signed)
Spoke with patient-- Pt is requesting a change in his mask for CPAP. Current full face mask is leaving pressure points on face and is leaking during the night per wife.  Pt requesting something that does not cover his whole face--requested Nasal Pillows specifically.  Dr Shelle Iron please advise. Thanks.

## 2013-06-18 NOTE — Telephone Encounter (Signed)
LMTCB

## 2013-06-18 NOTE — Telephone Encounter (Signed)
Ok with nasal pillows as long as he can keep his mouth closed.  If not, won't be a good choice.  There are numerous full face masks available, and there is one that will fit without pressure points.  He needs to go to his homecare company and get them to work with him on fit, possibly trying something different.

## 2013-06-18 NOTE — Telephone Encounter (Signed)
Pt returned call & can be reached at [phone].  Shane Moreno

## 2013-06-18 NOTE — Telephone Encounter (Signed)
Spoke with the pt and notified of recs per [facility] He verbalized understanding  Order was sent to [facility]

## 2013-07-01 ENCOUNTER — Ambulatory Visit (INDEPENDENT_AMBULATORY_CARE_PROVIDER_SITE_OTHER): Payer: Medicare HMO | Admitting: *Deleted

## 2013-07-01 ENCOUNTER — Encounter: Payer: Self-pay | Admitting: Internal Medicine

## 2013-07-01 DIAGNOSIS — Z23 Encounter for immunization: Secondary | ICD-10-CM

## 2013-11-10 ENCOUNTER — Other Ambulatory Visit: Payer: Self-pay

## 2013-11-10 MED ORDER — ENALAPRIL MALEATE 5 MG PO TABS: 5.0000 mg | ORAL_TABLET | Freq: Every day | ORAL | Status: DC

## 2013-11-10 MED ORDER — GLUCOSE BLOOD VI STRP: ORAL_STRIP | Status: DC

## 2013-11-10 MED ORDER — SIMVASTATIN 40 MG PO TABS: 40.0000 mg | ORAL_TABLET | Freq: Every evening | ORAL | Status: DC

## 2013-11-10 MED ORDER — METFORMIN HCL 1000 MG PO TABS: 1000.0000 mg | ORAL_TABLET | Freq: Two times a day (BID) | ORAL | Status: DC

## 2013-11-10 MED ORDER — METOPROLOL TARTRATE 25 MG PO TABS: 25.0000 mg | ORAL_TABLET | Freq: Every day | ORAL | Status: DC

## 2013-11-10 NOTE — Addendum Note (Signed)
Addended by: Scharlene GlossEWING, Breydon Senters B on: 11/10/2013 02:51 PM   Modules accepted: Orders

## 2013-12-16 ENCOUNTER — Other Ambulatory Visit (INDEPENDENT_AMBULATORY_CARE_PROVIDER_SITE_OTHER): Payer: Commercial Managed Care - HMO

## 2013-12-16 ENCOUNTER — Encounter: Payer: Self-pay | Admitting: Internal Medicine

## 2013-12-16 ENCOUNTER — Other Ambulatory Visit: Payer: Self-pay | Admitting: Internal Medicine

## 2013-12-16 ENCOUNTER — Ambulatory Visit (INDEPENDENT_AMBULATORY_CARE_PROVIDER_SITE_OTHER): Payer: Commercial Managed Care - HMO | Admitting: Internal Medicine

## 2013-12-16 VITALS — BP 120/82 | HR 76 | Temp 98.4°F | Ht 74.0 in | Wt 254.0 lb

## 2013-12-16 DIAGNOSIS — E1165 Type 2 diabetes mellitus with hyperglycemia: Secondary | ICD-10-CM

## 2013-12-16 DIAGNOSIS — IMO0001 Reserved for inherently not codable concepts without codable children: Secondary | ICD-10-CM

## 2013-12-16 DIAGNOSIS — Z0000 Encounter for general adult medical examination without abnormal findings: Secondary | ICD-10-CM

## 2013-12-16 LAB — URINALYSIS, ROUTINE W REFLEX MICROSCOPIC
Bilirubin Urine: NEGATIVE
Hgb urine dipstick: NEGATIVE
KETONES UR: NEGATIVE
LEUKOCYTES UA: NEGATIVE
Nitrite: NEGATIVE
PH: 6 (ref 5.0–8.0)
RBC / HPF: NONE SEEN (ref 0–?)
SPECIFIC GRAVITY, URINE: 1.025 (ref 1.000–1.030)
Total Protein, Urine: 30 — AB
UROBILINOGEN UA: 0.2 (ref 0.0–1.0)
Urine Glucose: NEGATIVE
WBC UA: NONE SEEN (ref 0–?)

## 2013-12-16 LAB — CBC WITH DIFFERENTIAL/PLATELET
BASOS ABS: 0 10*3/uL (ref 0.0–0.1)
Basophils Relative: 0.7 % (ref 0.0–3.0)
EOS PCT: 2.9 % (ref 0.0–5.0)
Eosinophils Absolute: 0.2 10*3/uL (ref 0.0–0.7)
HEMATOCRIT: 44.9 % (ref 39.0–52.0)
HEMOGLOBIN: 15.2 g/dL (ref 13.0–17.0)
LYMPHS ABS: 1.5 10*3/uL (ref 0.7–4.0)
LYMPHS PCT: 24.3 % (ref 12.0–46.0)
MCHC: 33.9 g/dL (ref 30.0–36.0)
MCV: 89.4 fl (ref 78.0–100.0)
MONOS PCT: 9.3 % (ref 3.0–12.0)
Monocytes Absolute: 0.6 10*3/uL (ref 0.1–1.0)
Neutro Abs: 3.8 10*3/uL (ref 1.4–7.7)
Neutrophils Relative %: 62.8 % (ref 43.0–77.0)
Platelets: 155 10*3/uL (ref 150.0–400.0)
RBC: 5.02 Mil/uL (ref 4.22–5.81)
RDW: 13.6 % (ref 11.5–14.6)
WBC: 6 10*3/uL (ref 4.5–10.5)

## 2013-12-16 LAB — BASIC METABOLIC PANEL
BUN: 17 mg/dL (ref 6–23)
CALCIUM: 9.3 mg/dL (ref 8.4–10.5)
CO2: 28 mEq/L (ref 19–32)
Chloride: 101 mEq/L (ref 96–112)
Creatinine, Ser: 0.8 mg/dL (ref 0.4–1.5)
GFR: 103.99 mL/min (ref >60.00)
Glucose, Bld: 166 mg/dL — ABNORMAL HIGH (ref 70–99)
Potassium: 4.8 mEq/L (ref 3.5–5.1)
Sodium: 137 mEq/L (ref 135–145)

## 2013-12-16 LAB — LIPID PANEL
CHOLESTEROL: 144 mg/dL (ref 0–200)
HDL: 39 mg/dL — ABNORMAL LOW (ref >39.00)
LDL CALC: 67 mg/dL (ref 0–99)
TRIGLYCERIDES: 192 mg/dL — AB (ref 0.0–149.0)
Total CHOL/HDL Ratio: 4
VLDL: 38.4 mg/dL (ref 0.0–40.0)

## 2013-12-16 LAB — HEPATIC FUNCTION PANEL
ALT: 25 U/L (ref 0–53)
AST: 23 U/L (ref 0–37)
Albumin: 4.3 g/dL (ref 3.5–5.2)
Alkaline Phosphatase: 48 U/L (ref 39–117)
BILIRUBIN TOTAL: 1 mg/dL (ref 0.3–1.2)
Bilirubin, Direct: 0.1 mg/dL (ref 0.0–0.3)
Total Protein: 7.1 g/dL (ref 6.0–8.3)

## 2013-12-16 LAB — MICROALBUMIN / CREATININE URINE RATIO
Creatinine,U: 132.5 mg/dL
Microalb Creat Ratio: 13.6 mg/g (ref 0.0–30.0)
Microalb, Ur: 18 mg/dL — ABNORMAL HIGH (ref 0.0–1.9)

## 2013-12-16 LAB — TSH: TSH: 2.15 u[IU]/mL (ref 0.35–5.50)

## 2013-12-16 LAB — HEMOGLOBIN A1C: Hgb A1c MFr Bld: 8.2 % — ABNORMAL HIGH (ref 4.6–6.5)

## 2013-12-16 LAB — PSA: PSA: 0.64 ng/mL (ref 0.10–4.00)

## 2013-12-16 MED ORDER — GLIPIZIDE ER 10 MG PO TB24: 10.0000 mg | ORAL_TABLET | Freq: Every day | ORAL | Status: DC

## 2013-12-16 NOTE — Patient Instructions (Addendum)
You had the new Prevnar pneumonia shot today  Please continue all other medications as before, and refills have been done if requested. Please have the pharmacy call with any other refills you may need.  Please continue your efforts at being more active, low cholesterol diet, and weight control. You are otherwise up to date with prevention measures today.  Please keep your appointments with your specialists as you have planned - Dr Shelda Palrenshaw  You will be contacted regarding the referral for: colonoscopy  Please remember to sign up for MyChart if you have not done so, as this will be important to you in the future with finding out test results, communicating by private email, and scheduling acute appointments online when needed.  Please return in 6 months, or sooner if needed, with Lab testing done 3-5 days before

## 2013-12-16 NOTE — Progress Notes (Signed)
Pre visit review using our clinic review tool, if applicable. No additional management support is needed unless otherwise documented below in the visit note. 
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
## 2013-12-17 DIAGNOSIS — Z0000 Encounter for general adult medical examination without abnormal findings: Secondary | ICD-10-CM | POA: Insufficient documentation

## 2013-12-17 NOTE — Assessment & Plan Note (Signed)

## 2014-01-04 ENCOUNTER — Ambulatory Visit (INDEPENDENT_AMBULATORY_CARE_PROVIDER_SITE_OTHER): Payer: Commercial Managed Care - HMO | Admitting: Cardiology

## 2014-01-04 ENCOUNTER — Encounter: Payer: Self-pay | Admitting: Cardiology

## 2014-01-04 VITALS — BP 140/90 | HR 77 | Ht 74.0 in | Wt 259.6 lb

## 2014-01-04 DIAGNOSIS — I4891 Unspecified atrial fibrillation: Secondary | ICD-10-CM

## 2014-01-04 DIAGNOSIS — I10 Essential (primary) hypertension: Secondary | ICD-10-CM

## 2014-01-04 MED ORDER — APIXABAN 5 MG PO TABS: 5.0000 mg | ORAL_TABLET | Freq: Two times a day (BID) | ORAL | Status: DC

## 2014-01-04 NOTE — Assessment & Plan Note (Signed)
Continue statin. 

## 2014-01-04 NOTE — Assessment & Plan Note (Signed)
Continue present blood pressure medications. 

## 2014-01-04 NOTE — Patient Instructions (Signed)
Your physician recommends that you schedule a follow-up appointment in: 8 WEEKS WITH DR CRENSHAW  STOP ASPIRIN  START ELIQUIS 5 MG ONE TABLET TWICE DAILY  Your physician recommends that you return for lab work in: 4 WEEKS  Your physician has requested that you have an echocardiogram. Echocardiography is a painless test that uses sound waves to create images of your heart. It provides your doctor with information about the size and shape of your heart and how well your heart's chambers and valves are working. This procedure takes approximately one hour. There are no restrictions for this procedure.

## 2014-01-04 NOTE — Assessment & Plan Note (Addendum)
Patient has a history of atrial fibrillation. He is now in atrial flutter today. He is completely asymptomatic. He has embolic risk factors of age greater than 2265, diabetes mellitus, hypertension and coronary artery disease. Discontinue aspirin. Begin apixaban 5 mg by mouth twice a day. Recent TSH normal. Check echocardiogram. I will see him back in 8 weeks. If he remains in flutter we will most likely proceed with cardioversion. If he does not hold sinus rhythm I would favor rate control and anticoagulation as he is asymptomatic. Continue Toprol for rate control. Check hemoglobin and renal function in 4 weeks.
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
## 2014-01-06 ENCOUNTER — Telehealth: Payer: Self-pay | Admitting: Cardiology

## 2014-01-06 DIAGNOSIS — I10 Essential (primary) hypertension: Secondary | ICD-10-CM

## 2014-01-06 DIAGNOSIS — I4891 Unspecified atrial fibrillation: Secondary | ICD-10-CM

## 2014-01-06 MED ORDER — APIXABAN 5 MG PO TABS: 5.0000 mg | ORAL_TABLET | Freq: Two times a day (BID) | ORAL | Status: DC

## 2014-01-06 NOTE — Telephone Encounter (Signed)
New message     Please fax a presc for eliquis to [facility] at [place].  He has a free card but need the actual presc faxed to the pharmacy

## 2014-01-06 NOTE — Telephone Encounter (Signed)
Spoke with pt, refill sent to the pharm 

## 2014-01-14 ENCOUNTER — Telehealth: Payer: Self-pay | Admitting: Cardiology

## 2014-01-14 NOTE — Telephone Encounter (Signed)
New message    FYI Having a test on 5-13.  Calling to remind us to get authorization from ins co---[phone]

## 2014-01-19 ENCOUNTER — Ambulatory Visit (HOSPITAL_COMMUNITY): Payer: Medicare HMO | Attending: Cardiovascular Disease | Admitting: Radiology

## 2014-01-19 DIAGNOSIS — I4891 Unspecified atrial fibrillation: Secondary | ICD-10-CM | POA: Insufficient documentation

## 2014-01-19 DIAGNOSIS — I10 Essential (primary) hypertension: Secondary | ICD-10-CM

## 2014-01-19 NOTE — Progress Notes (Signed)
Echocardiogram performed.  

## 2014-01-25 ENCOUNTER — Encounter: Payer: Self-pay | Admitting: Nurse Practitioner

## 2014-01-25 ENCOUNTER — Ambulatory Visit (INDEPENDENT_AMBULATORY_CARE_PROVIDER_SITE_OTHER): Payer: Commercial Managed Care - HMO | Admitting: Nurse Practitioner

## 2014-01-25 ENCOUNTER — Telehealth: Payer: Self-pay | Admitting: *Deleted

## 2014-01-25 VITALS — BP 125/78 | HR 77 | Ht 74.0 in | Wt 249.4 lb

## 2014-01-25 DIAGNOSIS — D689 Coagulation defect, unspecified: Secondary | ICD-10-CM

## 2014-01-25 DIAGNOSIS — Z1211 Encounter for screening for malignant neoplasm of colon: Secondary | ICD-10-CM

## 2014-01-25 MED ORDER — MOVIPREP 100 G PO SOLR: 1.0000 | ORAL | Status: DC

## 2014-01-25 NOTE — Telephone Encounter (Signed)
Patient to have cardioversion in near future and anticoagulation should not be interrupted at this time unless procedure has to be formed at present; could be DCed 4 weeks after upcoming cardioversion Shane Moreno

## 2014-01-25 NOTE — Progress Notes (Signed)
i agree with the above note.  Colonoscopy off blood thinners (if cardiologist feels it is safe to hold )

## 2014-01-25 NOTE — Telephone Encounter (Signed)
01/25/2014   RE: Shane Moreno DOB: 01/19/1941 MRN: [id number]   Dear Dr. Olga MillersBrian Crenshaw    We have scheduled the above patient for an endoscopic procedure. Our records show that he is on anticoagulation therapy.   Please advise as to how long the patient may come off his therapy of Eliquis prior to the procedure, which is scheduled for 02-07-2014.  Please fax back/ or route the completed form to [facility] A[facility]gail Galeno CMA at [phone].   Sincerely,    Willette ClusterPaula Guenther NP-C [facility]

## 2014-01-25 NOTE — Progress Notes (Signed)
HPI :   Patient is a 73 year old male, former patient of Dr. Corinda [facility]lerLeBauer. He was last seen here at time of colonoscopy in 2002. Patient is scheduled for a screening colonoscopy but needed to be evaluated first because he is on a blood thinner.  Patient was started on Apixaban a few weeks ago for atrial flutter. Echocardiogram earlier this month revealed ejection fraction of 50-55%. Patient has no shortness of breath or chest pain. He has no gastrointestinal complaints such as bowel changes or rectal bleeding.  Past Medical History  Diagnosis Date  . CAD 10/12/2009  . Atrial fibrillation 10/11/2009  . OBSTRUCTIVE SLEEP APNEA 10/20/2009  . MITRAL VALVE PROLAPSE 10/11/2009  . HYPERTENSION 10/20/2009  . HYPERLIPIDEMIA 10/11/2009  . GERD 10/11/2009  . DIABETES MELLITUS, TYPE II 10/20/2009  . ANEMIA, IRON DEFICIENCY 10/11/2009  . Cardiac arrhythmia due to congenital heart disease   . Kidney stones     Family History  Problem Relation Age of Onset  . Cancer Mother   . Hypertension Father    History  Substance Use Topics  . Smoking status: Former Smoker -- 4.00 packs/day for 20 years    Types: Cigarettes    Quit date: 09/09/1978  . Smokeless tobacco: Never Used  . Alcohol Use: No   Current Outpatient Prescriptions  Medication Sig Dispense Refill  . apixaban (ELIQUIS) 5 MG TABS tablet Take 1 tablet (5 mg total) by mouth 2 (two) times daily.  60 tablet  6  . Cholecalciferol (VITAMIN D) 1000 UNITS capsule Take 1,000 Units by mouth daily.      Marland Kitchen. co-enzyme Q-10 30 MG capsule Take 100 mg by mouth daily.       . enalapril (VASOTEC) 5 MG tablet Take 1 tablet (5 mg total) by mouth daily.  90 tablet  3  . glipiZIDE (GLUCOTROL XL) 10 MG 24 hr tablet Take 1 tablet (10 mg total) by mouth daily with breakfast.  90 tablet  3  . glucose blood (TRUETEST TEST) test strip Use as instructed  100 each  3  . Lancets (FREESTYLE) lancets Use as directed once per day  100 each  3  . metFORMIN (GLUCOPHAGE) 1000 MG tablet  Take 1 tablet (1,000 mg total) by mouth 2 (two) times daily.  180 tablet  3  . metoprolol tartrate (LOPRESSOR) 25 MG tablet Take 1 tablet (25 mg total) by mouth daily.  90 tablet  3  . simvastatin (ZOCOR) 40 MG tablet Take 1 tablet (40 mg total) by mouth every evening.  90 tablet  3   No current facility-administered medications for this visit.   No Known Allergies   Review of Systems: All systems reviewed and negative except where noted in HPI.   Physical Exam: BP 125/78  Pulse 77  Ht 6\' 2"  (1.88 m)  Wt 249 lb 6.4 oz (113.127 kg)  BMI 32.01 kg/m2 Constitutional: Pleasant,well-developed, white male in no acute distress. HEENT: Normocephalic and atraumatic. Conjunctivae are normal. No scleral icterus. Neck supple.  Cardiovascular: Normal rate, regular rhythm.  Pulmonary/chest: Effort normal and breath sounds normal. No wheezing, rales or rhonchi. Abdominal: Soft, nondistended, nontender. Bowel sounds active throughout. There are no masses palpable. No hepatomegaly. Extremities: no edema Lymphadenopathy: No cervical adenopathy noted. Neurological: Alert and oriented to person place and time. Skin: Skin is warm and dry. No rashes noted. Psychiatric: Normal mood and affect. Behavior is normal.   ASSESSMENT AND PLAN: 371. 73 year old male for colon cancer screening. His last colonoscopy was greater  than 10 years ago. Patient has no gastrointestinal or general medical complaints. The risks, benefits, and alternatives to colonoscopy with possible biopsy and possible polypectomy were discussed with the patient and he consents to proceed.   2. CAD, s/p remote PCI.  3. Afib / A flutter. Recently started on Apixaban. Will contact cardiologist about hold medication for the colonoscopy.   4. Diabetes, on oral meds.

## 2014-01-25 NOTE — Telephone Encounter (Signed)
Dr. Jens Somrenshaw, Do you know the date of his cardioversion?  So you are saying the patient can hold the Eliquis for about 4 weeks after the cardioversion.  We can reschedule the colonoscopy. That will be no problem.

## 2014-01-25 NOTE — Patient Instructions (Signed)
You have been scheduled for a colonoscopy with propofol. Please follow written instructions given to you at your visit today.  Please pick up your prep kit at the pharmacy within the next 1-3 days. If you use inhalers (even only as needed), please bring them with you on the day of your procedure.  We will notify you once we hear back from Dr. Stanford Breed.

## 2014-01-26 ENCOUNTER — Telehealth: Payer: Self-pay | Admitting: *Deleted

## 2014-01-26 NOTE — Telephone Encounter (Signed)
I asked Mrs. Shane Moreno to have her husband call me. I explained we scheduled Shane Moreno for the colonoscopy for 02-07-2014.  Dr Jens Somrenshaw advised Shane Moreno he is going to be scheduled for a Cardioversion when he comes for his office visit on 02-10-2014. He will be off the Eliquis after that for a time and that would be a good time to schedule the colonoscopy.  I will reschedule him for the colonoscopy with Dr. Inocencio HomesJay Moreno when he calls me back.

## 2014-01-26 NOTE — Telephone Encounter (Signed)
Date of carioversion will be set at next office visit 02-10-14. Has not been scheduled yet

## 2014-01-26 NOTE — Telephone Encounter (Signed)
I spoke to the patient and he knows he has an appointment with Dr. Jens Somrenshaw on 02-10-2014.  He will be scheduled for a cardioversion.  We changed his colonoscopy appointment to 03-16-2014 at 8:30 am .  The patient wanted an 8:30 appointment.  I asked him to please call me after he discusses the Eliquis situation.  I asked the patient to call me back after he ses Dr. Jens Somrenshaw.  He thanked me for working with him and said he will call me regarding his Eliquis.

## 2014-01-26 NOTE — Telephone Encounter (Signed)
Spoke to the patient discussed with him his appointment with Dr. Jens Somrenshaw on 02-10-2014.  He will be scheduled for a cardioversion test soon after his appointment.  We rescheduled him for a colonoscopy for 03-16-2014 at 8:30am.  I had changed him to 6-30 but he wanted an 8:30 am so we had to go out to July. I asked him to inform Dr. Jens Somrenshaw of this 7-8 date and discuss with him the Eliquis situation . I asked him to please call me with the Eliquis information.  He said he would be glad to.

## 2014-01-26 NOTE — Telephone Encounter (Signed)
Pam, thanks for follow up. Please make sure we touch base with Dr. Jens Somrenshaw following cardioversion. I am not sure he will feel comfortable with patient holding Eliquis and having procedure that soon after cardioversion but I don't know that for sure. Thanks again.  I will forward this to Dr.Jacobs as well.

## 2014-01-26 NOTE — Telephone Encounter (Signed)
I did cancel the colonoscopy for 02-07-2014 and rescheduled him for 03-08-2014 at 10:00 am with Dr. Erick BlinksJay Pyrtle.  Will speak to the patient when he calls me back and advise him of this date and ask him if he that date is okay.

## 2014-02-01 ENCOUNTER — Other Ambulatory Visit (INDEPENDENT_AMBULATORY_CARE_PROVIDER_SITE_OTHER): Payer: Commercial Managed Care - HMO

## 2014-02-01 DIAGNOSIS — I10 Essential (primary) hypertension: Secondary | ICD-10-CM

## 2014-02-01 DIAGNOSIS — I4891 Unspecified atrial fibrillation: Secondary | ICD-10-CM

## 2014-02-01 LAB — BASIC METABOLIC PANEL
BUN: 22 mg/dL (ref 6–23)
CALCIUM: 9.2 mg/dL (ref 8.4–10.5)
CHLORIDE: 105 meq/L (ref 96–112)
CO2: 26 mEq/L (ref 19–32)
Creatinine, Ser: 1 mg/dL (ref 0.4–1.5)
GFR: 79.88 mL/min (ref >60.00)
Glucose, Bld: 148 mg/dL — ABNORMAL HIGH (ref 70–99)
Potassium: 4.2 mEq/L (ref 3.5–5.1)
SODIUM: 139 meq/L (ref 135–145)

## 2014-02-01 LAB — CBC WITH DIFFERENTIAL/PLATELET
BASOS ABS: 0 10*3/uL (ref 0.0–0.1)
Basophils Relative: 0.5 % (ref 0.0–3.0)
Eosinophils Absolute: 0.1 10*3/uL (ref 0.0–0.7)
Eosinophils Relative: 2.8 % (ref 0.0–5.0)
HCT: 42 % (ref 39.0–52.0)
Hemoglobin: 14.3 g/dL (ref 13.0–17.0)
LYMPHS ABS: 1.4 10*3/uL (ref 0.7–4.0)
Lymphocytes Relative: 27.8 % (ref 12.0–46.0)
MCHC: 33.9 g/dL (ref 30.0–36.0)
MCV: 89.6 fl (ref 78.0–100.0)
MONOS PCT: 10.7 % (ref 3.0–12.0)
Monocytes Absolute: 0.6 10*3/uL (ref 0.1–1.0)
Neutro Abs: 3 10*3/uL (ref 1.4–7.7)
Neutrophils Relative %: 58.2 % (ref 43.0–77.0)
Platelets: 131 10*3/uL — ABNORMAL LOW (ref 150.0–400.0)
RBC: 4.69 Mil/uL (ref 4.22–5.81)
RDW: 13 % (ref 11.5–15.5)
WBC: 5.2 10*3/uL (ref 4.0–10.5)

## 2014-02-07 ENCOUNTER — Encounter: Payer: Commercial Managed Care - HMO | Admitting: Internal Medicine

## 2014-02-10 ENCOUNTER — Telehealth: Payer: Self-pay | Admitting: Cardiology

## 2014-02-10 ENCOUNTER — Encounter: Payer: Self-pay | Admitting: Cardiology

## 2014-02-10 ENCOUNTER — Encounter: Payer: Self-pay | Admitting: *Deleted

## 2014-02-10 ENCOUNTER — Other Ambulatory Visit: Payer: Self-pay | Admitting: Cardiology

## 2014-02-10 ENCOUNTER — Ambulatory Visit (INDEPENDENT_AMBULATORY_CARE_PROVIDER_SITE_OTHER): Payer: Commercial Managed Care - HMO | Admitting: Cardiology

## 2014-02-10 VITALS — BP 129/77 | HR 90 | Ht 74.0 in | Wt 246.8 lb

## 2014-02-10 DIAGNOSIS — I2510 Atherosclerotic heart disease of native coronary artery without angina pectoris: Secondary | ICD-10-CM

## 2014-02-10 DIAGNOSIS — E785 Hyperlipidemia, unspecified: Secondary | ICD-10-CM

## 2014-02-10 DIAGNOSIS — I4892 Unspecified atrial flutter: Secondary | ICD-10-CM

## 2014-02-10 DIAGNOSIS — I10 Essential (primary) hypertension: Secondary | ICD-10-CM

## 2014-02-10 DIAGNOSIS — I4891 Unspecified atrial fibrillation: Secondary | ICD-10-CM

## 2014-02-10 NOTE — Assessment & Plan Note (Signed)
Continue statin. 

## 2014-02-10 NOTE — Telephone Encounter (Signed)
New problem         Pt needs a call back to discuss his medications please.

## 2014-02-10 NOTE — Telephone Encounter (Signed)
Continue for now; unlikely to be related. [name]

## 2014-02-10 NOTE — Telephone Encounter (Signed)
Spoke with pt, pt wanted to let us know his simvastatin has the side effect of causing irregular heart beat. Does he need to change? Will forward for dr Jens Som review

## 2014-02-10 NOTE — Telephone Encounter (Signed)
Spoke with pt, Aware of dr crenshaw's recommendations.  °

## 2014-02-10 NOTE — Assessment & Plan Note (Signed)
Patient remains asymptomatic. Continue present medications for rate control. Continue apixaban. Will arrange cardioversion. Note he had one isolated episode of atrial fibrillation in 2004. Hopefully he will hold sinus rhythm. If not I would favor rate control and anticoagulation as he is asymptomatic.
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
## 2014-02-10 NOTE — Assessment & Plan Note (Signed)
Continue present blood pressure medications. 

## 2014-02-10 NOTE — Patient Instructions (Signed)

## 2014-02-11 ENCOUNTER — Encounter (HOSPITAL_COMMUNITY): Payer: Self-pay | Admitting: Pharmacy Technician

## 2014-02-21 ENCOUNTER — Ambulatory Visit (HOSPITAL_COMMUNITY)
Admission: RE | Admit: 2014-02-21 | Discharge: 2014-02-21 | Disposition: A | Discharge status: Home / Self Care | Payer: Medicare HMO | Source: Ambulatory Visit | Attending: Cardiology | Admitting: Cardiology

## 2014-02-21 ENCOUNTER — Encounter (HOSPITAL_COMMUNITY): Payer: Medicare HMO | Admitting: Certified Registered Nurse Anesthetist

## 2014-02-21 ENCOUNTER — Ambulatory Visit (HOSPITAL_COMMUNITY): Payer: Medicare HMO | Admitting: Certified Registered Nurse Anesthetist

## 2014-02-21 ENCOUNTER — Encounter (HOSPITAL_COMMUNITY): Payer: Self-pay | Admitting: Certified Registered Nurse Anesthetist

## 2014-02-21 ENCOUNTER — Encounter (HOSPITAL_COMMUNITY)
Admission: RE | Disposition: A | Discharge status: Home / Self Care | Payer: Self-pay | Source: Ambulatory Visit | Attending: Cardiology

## 2014-02-21 DIAGNOSIS — I2510 Atherosclerotic heart disease of native coronary artery without angina pectoris: Secondary | ICD-10-CM | POA: Insufficient documentation

## 2014-02-21 DIAGNOSIS — E119 Type 2 diabetes mellitus without complications: Secondary | ICD-10-CM | POA: Insufficient documentation

## 2014-02-21 DIAGNOSIS — I4891 Unspecified atrial fibrillation: Secondary | ICD-10-CM | POA: Diagnosis not present

## 2014-02-21 DIAGNOSIS — I10 Essential (primary) hypertension: Secondary | ICD-10-CM | POA: Insufficient documentation

## 2014-02-21 DIAGNOSIS — K219 Gastro-esophageal reflux disease without esophagitis: Secondary | ICD-10-CM | POA: Insufficient documentation

## 2014-02-21 DIAGNOSIS — I4892 Unspecified atrial flutter: Secondary | ICD-10-CM | POA: Insufficient documentation

## 2014-02-21 DIAGNOSIS — G4730 Sleep apnea, unspecified: Secondary | ICD-10-CM | POA: Insufficient documentation

## 2014-02-21 DIAGNOSIS — Z87891 Personal history of nicotine dependence: Secondary | ICD-10-CM | POA: Insufficient documentation

## 2014-02-21 HISTORY — PX: CARDIOVERSION: SHX1299

## 2014-02-21 LAB — GLUCOSE, CAPILLARY: Glucose-Capillary: 161 mg/dL — ABNORMAL HIGH (ref 70–99)

## 2014-02-21 SURGERY — CARDIOVERSION
Anesthesia: Monitor Anesthesia Care

## 2014-02-21 MED ORDER — SODIUM CHLORIDE 0.9 % IV SOLN
INTRAVENOUS | Status: DC | PRN
  Administered 2014-02-21: 10:00:00 via INTRAVENOUS

## 2014-02-21 MED ORDER — SODIUM CHLORIDE 0.9 % IV SOLN
INTRAVENOUS | Status: DC
  Administered 2014-02-21: 500 mL via INTRAVENOUS

## 2014-02-21 MED ORDER — PROPOFOL 10 MG/ML IV BOLUS
INTRAVENOUS | Status: DC | PRN
  Administered 2014-02-21: 60 mg via INTRAVENOUS

## 2014-02-21 MED ORDER — LIDOCAINE HCL (CARDIAC) 20 MG/ML IV SOLN
INTRAVENOUS | Status: DC | PRN
  Administered 2014-02-21: 40 mg via INTRAVENOUS

## 2014-02-21 NOTE — Transfer of Care (Signed)
Immediate Anesthesia Transfer of Care Note  Patient: Shane Moreno  Procedure(s) Performed: Procedure(s): CARDIOVERSION (N/A)  Patient Location: PACU  Anesthesia Type:MAC  Level of Consciousness: awake, alert , oriented and patient cooperative  Airway & Oxygen Therapy: Patient Spontanous Breathing and Patient connected to nasal cannula oxygen  Post-op Assessment: Report given to PACU RN, Post -op Vital signs reviewed and stable and Patient moving all extremities  Post vital signs: Reviewed and stable  Complications: No apparent anesthesia complications

## 2014-02-21 NOTE — Anesthesia Postprocedure Evaluation (Signed)
  Anesthesia Post-op Note  Patient: Shane Moreno  Procedure(s) Performed: Procedure(s): CARDIOVERSION (N/A)  Patient Location: Endoscopy Unit  Anesthesia Type:General  Level of Consciousness: awake, alert  and oriented  Airway and Oxygen Therapy: Patient Spontanous Breathing  Post-op Pain: none  Post-op Assessment: Post-op Vital signs reviewed, Patient's Cardiovascular Status Stable, Respiratory Function Stable, Patent Airway and Pain level controlled  Post-op Vital Signs: stable  Last Vitals:  Filed Vitals:   02/21/14 1125  BP: 106/58  Pulse: 64  Temp:   Resp: 18    Complications: No apparent anesthesia complications

## 2014-02-21 NOTE — Anesthesia Preprocedure Evaluation (Addendum)
Anesthesia Evaluation  Patient identified by MRN, date of birth, ID band Patient awake    Reviewed: Allergy & Precautions, H&P , NPO status , Patient's Chart, lab work & pertinent test results, reviewed documented beta blocker date and time   History of Anesthesia Complications Negative for: history of anesthetic complications  Airway Mallampati: I TM Distance: >3 FB Neck ROM: Full    Dental  (+) Dental Advisory Given, Upper Dentures, Partial Lower   Pulmonary sleep apnea , former smoker,  breath sounds clear to auscultation        Cardiovascular hypertension, Pt. on medications and Pt. on home beta blockers + CAD + dysrhythmias Atrial Fibrillation Rhythm:Irregular  01/19/14 EF 50-55%   Neuro/Psych    GI/Hepatic GERD-  ,  Endo/Other  diabetes, Type 2, Oral Hypoglycemic Agents  Renal/GU      Musculoskeletal   Abdominal   Peds  Hematology   Anesthesia Other Findings   Reproductive/Obstetrics                       Anesthesia Physical Anesthesia Plan  ASA: III  Anesthesia Plan: MAC   Post-op Pain Management:    Induction: Intravenous  Airway Management Planned: Mask and Natural Airway  Additional Equipment:   Intra-op Plan:   Post-operative Plan:   Informed Consent: I have reviewed the patients History and Physical, chart, labs and discussed the procedure including the risks, benefits and alternatives for the proposed anesthesia with the patient or authorized representative who has indicated his/her understanding and acceptance.   Dental advisory given  Plan Discussed with: CRNA, Anesthesiologist and Surgeon  Anesthesia Plan Comments:         Anesthesia Quick Evaluation

## 2014-02-21 NOTE — Procedures (Signed)
Electrical Cardioversion Procedure Note Dewaine CongerLeonard A Buchan [id number] 07/09/1941  Procedure: Electrical Cardioversion Indications:  Atrial Flutter  Procedure Details Consent: Risks of procedure as well as the alternatives and risks of each were explained to the (patient/caregiver).  Consent for procedure obtained. Time Out: Verified patient identification, verified procedure, site/side was marked, verified correct patient position, special equipment/implants available, medications/allergies/relevent history reviewed, required imaging and test results available.  Performed  Patient placed on cardiac monitor, pulse oximetry, supplemental oxygen as necessary.  Sedation given: Patient sedated by anesthesia with diprovan 60 mg IV. Pacer pads placed anterior and posterior chest.  Cardioverted 1 time(s).  Cardioverted at 120J.  Evaluation Findings: Post procedure EKG shows: NSR Complications: None Patient did tolerate procedure well.   Olga MillersBrian Crenshaw 02/21/2014, 9:25 AM
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
## 2014-02-21 NOTE — Anesthesia Procedure Notes (Signed)
Procedure Name: MAC Date/Time: 02/21/2014 10:30 AM Performed by: Angelica PouSMITH, Amarri Michaelson PIZZICARA Pre-anesthesia Checklist: Patient identified, Timeout performed, Emergency Drugs available, Suction available and Patient being monitored Patient Re-evaluated:Patient Re-evaluated prior to inductionOxygen Delivery Method: Ambu bag Preoxygenation: Pre-oxygenation with 100% oxygen Intubation Type: IV induction Ventilation: Mask ventilation without difficulty

## 2014-02-21 NOTE — Discharge Instructions (Signed)
Electrical Cardioversion, Care After °Refer to this sheet in the next few weeks. These instructions provide you with information on caring for yourself after your procedure. Your health care provider may also give you more specific instructions. Your treatment has been planned according to current medical practices, but problems sometimes occur. Call your health care provider if you have any problems or questions after your procedure. °WHAT TO EXPECT AFTER THE PROCEDURE °After your procedure, it is typical to have the following sensations: °· Some redness on the skin where the shocks were delivered. If this is tender, a sunburn lotion or hydrocortisone cream may help. °· Possible return of an abnormal heart rhythm within hours or days after the procedure. °HOME CARE INSTRUCTIONS °· Only take medicine as directed by your health care provider. Be sure you understand how and when to take your medicine. °· Learn how to feel your pulse and check it often. °· Limit your activity for 48 hours after the procedure or as directed. °· Avoid or minimize caffeine and other stimulants as directed. °SEEK MEDICAL CARE IF: °· You feel like your heart is beating too fast or your pulse is not regular. °· You have any questions about your medicines. °· You have bleeding that will not stop. °SEEK IMMEDIATE MEDICAL CARE IF: °· You are dizzy or feel faint. °· It is hard to breathe or you feel short of breath. °· There is a change in discomfort in your chest. °· Your speech is slurred or you have trouble moving an arm or leg on one side of your body. °· You get a serious muscle cramp that does not go away. °· Your fingers or toes turn cold or blue. °MAKE SURE YOU:  °· Understand these instructions.   °· Will watch your condition.   °· Will get help right away if you are not doing well or get worse. °Document Released: 06/16/2013 Document Reviewed: 03/10/2013 °[employer]® Patient Information ©2014 [employer]. ° °

## 2014-02-22 ENCOUNTER — Encounter (HOSPITAL_COMMUNITY): Payer: Self-pay | Admitting: Cardiology

## 2014-03-08 ENCOUNTER — Encounter: Payer: Commercial Managed Care - HMO | Admitting: Internal Medicine

## 2014-03-16 ENCOUNTER — Encounter: Payer: Commercial Managed Care - HMO | Admitting: Internal Medicine

## 2014-03-24 ENCOUNTER — Telehealth: Payer: Self-pay

## 2014-03-24 NOTE — Telephone Encounter (Signed)
LVMM to call and schedule CPE with PCP

## 2014-06-15 LAB — HM DIABETES EYE EXAM

## 2014-06-22 ENCOUNTER — Encounter: Payer: Self-pay | Admitting: Internal Medicine

## 2014-06-22 ENCOUNTER — Ambulatory Visit (INDEPENDENT_AMBULATORY_CARE_PROVIDER_SITE_OTHER): Payer: Commercial Managed Care - HMO | Admitting: Internal Medicine

## 2014-06-22 ENCOUNTER — Encounter: Payer: Self-pay | Admitting: Gastroenterology

## 2014-06-22 ENCOUNTER — Other Ambulatory Visit (INDEPENDENT_AMBULATORY_CARE_PROVIDER_SITE_OTHER): Payer: Commercial Managed Care - HMO

## 2014-06-22 VITALS — BP 132/80 | HR 71 | Temp 98.2°F | Ht 74.0 in | Wt 248.0 lb

## 2014-06-22 DIAGNOSIS — Z23 Encounter for immunization: Secondary | ICD-10-CM

## 2014-06-22 DIAGNOSIS — Z0189 Encounter for other specified special examinations: Secondary | ICD-10-CM

## 2014-06-22 DIAGNOSIS — E119 Type 2 diabetes mellitus without complications: Secondary | ICD-10-CM

## 2014-06-22 DIAGNOSIS — E785 Hyperlipidemia, unspecified: Secondary | ICD-10-CM

## 2014-06-22 DIAGNOSIS — Z7901 Long term (current) use of anticoagulants: Secondary | ICD-10-CM

## 2014-06-22 DIAGNOSIS — I10 Essential (primary) hypertension: Secondary | ICD-10-CM

## 2014-06-22 DIAGNOSIS — Z0000 Encounter for general adult medical examination without abnormal findings: Secondary | ICD-10-CM

## 2014-06-22 DIAGNOSIS — Z1211 Encounter for screening for malignant neoplasm of colon: Secondary | ICD-10-CM

## 2014-06-22 LAB — LIPID PANEL
CHOLESTEROL: 164 mg/dL (ref 0–200)
HDL: 31.5 mg/dL — ABNORMAL LOW (ref >39.00)
LDL Cholesterol: 93 mg/dL (ref 0–99)
NONHDL: 132.5
Total CHOL/HDL Ratio: 5
Triglycerides: 196 mg/dL — ABNORMAL HIGH (ref 0.0–149.0)
VLDL: 39.2 mg/dL (ref 0.0–40.0)

## 2014-06-22 LAB — HEPATIC FUNCTION PANEL
ALT: 21 U/L (ref 0–53)
AST: 25 U/L (ref 0–37)
Albumin: 3.8 g/dL (ref 3.5–5.2)
Alkaline Phosphatase: 50 U/L (ref 39–117)
BILIRUBIN DIRECT: 0.1 mg/dL (ref 0.0–0.3)
Total Bilirubin: 0.9 mg/dL (ref 0.2–1.2)
Total Protein: 7.6 g/dL (ref 6.0–8.3)

## 2014-06-22 LAB — BASIC METABOLIC PANEL
BUN: 20 mg/dL (ref 6–23)
CALCIUM: 9.4 mg/dL (ref 8.4–10.5)
CO2: 30 meq/L (ref 19–32)
Chloride: 103 mEq/L (ref 96–112)
Creatinine, Ser: 0.9 mg/dL (ref 0.4–1.5)
GFR: 84.76 mL/min (ref >60.00)
GLUCOSE: 171 mg/dL — AB (ref 70–99)
POTASSIUM: 4.9 meq/L (ref 3.5–5.1)
SODIUM: 139 meq/L (ref 135–145)

## 2014-06-22 LAB — HEMOGLOBIN A1C: HEMOGLOBIN A1C: 7.4 % — AB (ref 4.6–6.5)

## 2014-06-22 NOTE — Patient Instructions (Addendum)

## 2014-06-22 NOTE — Assessment & Plan Note (Signed)
stable overall by history and exam, recent data reviewed with pt, and pt to continue medical treatment as before,  to f/u any worsening symptoms or concerns le BP Readings from Last 3 Encounters:  06/22/14 132/80  02/21/14 106/58  02/21/14 106/58

## 2014-06-22 NOTE — Assessment & Plan Note (Signed)
Still due for colonoscopy fu, now on eliquis - will refer GI

## 2014-06-22 NOTE — Assessment & Plan Note (Signed)
stable overall by history and exam, recent data reviewed with pt, and pt to continue medical treatment as before,  to f/u any worsening symptoms or concerns Lab Results  Component Value Date   HGBA1C 8.2* 12/16/2013

## 2014-06-22 NOTE — Progress Notes (Signed)
Pre visit review using our clinic review tool, if applicable. No additional management support is needed unless otherwise documented below in the visit note. 

## 2014-06-22 NOTE — Addendum Note (Signed)
Addended by: Corwin LevinsJOHN, Roston Grunewald W on: 06/22/2014 12:49 PM   Modules accepted: Orders
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
## 2014-06-22 NOTE — Assessment & Plan Note (Signed)
stable overall by history and exam, recent data reviewed with pt, and pt to continue medical treatment as before,  to f/u any worsening symptoms or concerns Lab Results  Component Value Date   LDLCALC 67 12/16/2013

## 2014-06-28 ENCOUNTER — Encounter: Payer: Self-pay | Admitting: Internal Medicine

## 2014-06-29 ENCOUNTER — Telehealth: Payer: Self-pay | Admitting: *Deleted

## 2014-06-29 NOTE — Telephone Encounter (Signed)
Left msg on triage wanting to know when he first saw Dr. Jonny RuizJohn. Called pt back inform him he was first establish with md 10/2009...Raechel Chute/lmb

## 2014-07-06 ENCOUNTER — Ambulatory Visit (INDEPENDENT_AMBULATORY_CARE_PROVIDER_SITE_OTHER): Payer: Commercial Managed Care - HMO | Admitting: Gastroenterology

## 2014-07-06 ENCOUNTER — Telehealth: Payer: Self-pay | Admitting: *Deleted

## 2014-07-06 ENCOUNTER — Encounter: Payer: Self-pay | Admitting: Gastroenterology

## 2014-07-06 VITALS — BP 128/80 | HR 64 | Ht 74.0 in | Wt 253.4 lb

## 2014-07-06 DIAGNOSIS — Z1211 Encounter for screening for malignant neoplasm of colon: Secondary | ICD-10-CM | POA: Insufficient documentation

## 2014-07-06 DIAGNOSIS — Z7901 Long term (current) use of anticoagulants: Secondary | ICD-10-CM

## 2014-07-06 NOTE — Patient Instructions (Signed)

## 2014-07-06 NOTE — Progress Notes (Signed)
Reviewed and agree with management plan.  Lizmary Nader T. Aqil Goetting, MD FACG 

## 2014-07-06 NOTE — Telephone Encounter (Signed)
Patient notified for hold Eliquis per Dr. Ludwig Clarksrenshaw's instructions

## 2014-07-06 NOTE — Telephone Encounter (Signed)
Left message for patient to call back  

## 2014-07-06 NOTE — Telephone Encounter (Signed)
Hold apixaban 2 days prior to procedure and resume day after Shane Moreno  

## 2014-07-06 NOTE — Progress Notes (Signed)
     07/06/2014 Shane Moreno [id number] 03/12/1941   History of Present Illness:  Patient is a 73 year old male, former patient of Dr. Corinda GublerLeBauer. He was last seen here at time of colonoscopy in 2002 at which time he was found to have only internal and external hemorrhoids. Patient is here today to schedule a screening colonoscopy but needed to be evaluated first because he is on a blood thinner.  Patient was started on Apixaban in May 2015 for Afib.  He has subsequently been cardioverted.  He denies any GI complaints except for rare bright red blood from his rectum that he contributes to his hemorrhoids.  No other complaints.  Dr. Jens Somrenshaw is his cardiologist.   Current Medications, Allergies, Past Medical History, Past Surgical History, Family History and Social History were reviewed in [facility] electronic medical record.   Physical Exam: BP 128/80  Pulse 64  Ht 6\' 2"  (1.88 m)  Wt 253 lb 6.4 oz (114.941 kg)  BMI 32.52 kg/m2 General: Well developed white male in no acute distress Head: Normocephalic and atraumatic Eyes:  Sclerae anicteric, conjunctiva pink  Ears: Normal auditory acuity Lungs: Clear throughout to auscultation Heart:  Irregular. Abdomen: Soft, non-distended.  Normal bowel sounds.  Non-tender. Rectal:  Deferred.  Will be done at the time of colonoscopy. Musculoskeletal: Symmetrical with no gross deformities  Extremities: No edema  Neurological: Alert oriented x 4, grossly non-focal Psychological:  Alert and cooperative. Normal mood and affect  Assessment and Recommendations: 661. 73 year old male for colon cancer screening. His last colonoscopy was greater than 10 years ago with Dr. Corinda GublerLeBauer.  The risks, benefits, and alternatives to colonoscopy with possible biopsy and possible polypectomy were discussed with the patient and he consents to proceed.   2. CAD, s/p remote PCI.  3. Afib/A flutter:  On Apixaban since approximately May 2015.  The risks benefits and  alternatives to a temporary hold of anti-coagulants/anti-platelets for the procedure were discussed with the patient he consents to proceed. Obtain clearance from Dr. Jens Somrenshaw for ok to hold Eliquis.  4. Diabetes, on oral meds.

## 2014-07-06 NOTE — Telephone Encounter (Signed)
  07/06/2014   RE: Shane Moreno DOB: 04/05/1941 MRN: [id number]   Dear Dr. Jens Somrenshaw,    We have scheduled the above patient for an Colonoscopy. Our records show that he is on anticoagulation therapy.   Please advise as to how long the patient may come off his therapy of Eliquis prior to the procedure, which is scheduled for 07-19-2014.  Please route the completed form to Landry DykeKelly S., CMA   Sincerely,    Shane Moreno

## 2014-07-19 ENCOUNTER — Encounter: Payer: Self-pay | Admitting: Gastroenterology

## 2014-07-19 ENCOUNTER — Ambulatory Visit (AMBULATORY_SURGERY_CENTER): Payer: Commercial Managed Care - HMO | Admitting: Gastroenterology

## 2014-07-19 VITALS — BP 108/66 | HR 81 | Temp 97.3°F | Resp 14 | Ht 74.0 in | Wt 253.0 lb

## 2014-07-19 DIAGNOSIS — Z1211 Encounter for screening for malignant neoplasm of colon: Secondary | ICD-10-CM

## 2014-07-19 LAB — GLUCOSE, CAPILLARY
GLUCOSE-CAPILLARY: 171 mg/dL — AB (ref 70–99)
GLUCOSE-CAPILLARY: 166 mg/dL — AB (ref 70–99)

## 2014-07-19 MED ORDER — SODIUM CHLORIDE 0.9 % IV SOLN: 500.0000 mL | INTRAVENOUS | Status: DC

## 2014-07-19 NOTE — Progress Notes (Signed)
Called to room to assist during endoscopic procedure.  Patient ID and intended procedure confirmed with present staff. Received instructions for my participation in the procedure from the performing physician.  

## 2014-07-19 NOTE — Op Note (Signed)
[facility] [address] [place], [id number]   COLONOSCOPY PROCEDURE REPORT  PATIENT: Shane Moreno, Shane Moreno  MR#: [id number] BIRTHDATE: July 07, 1941 , 73  yrs. old GENDER: male ENDOSCOPIST: Meryl DareMalcolm T Tejay Hubert, MD, [facility] PROCEDURE DATE:  07/19/2014 PROCEDURE:   Colonoscopy, screening First Screening Colonoscopy - Avg.  risk and is 50 yrs.  old or older - No.  Prior Negative Screening - Now for repeat screening. 10 or more years since last screening  History of Adenoma - Now for follow-up colonoscopy & has been > or = to 3 yrs.  N/Moreno  Polyps Removed Today? No.  Polyps Removed Today? No.  Recommend repeat exam, <10 yrs? Polyps Removed Today? No.  Recommend repeat exam, <10 yrs? No. ASA CLASS:   Class III INDICATIONS:average risk for colorectal cancer. MEDICATIONS: Monitored anesthesia care and Propofol 200 mg IV DESCRIPTION OF PROCEDURE:   After the risks benefits and alternatives of the procedure were thoroughly explained, informed consent was obtained.  The digital rectal exam revealed no abnormalities of the rectum.   The LB [id number]  endoscope was introduced through the anus and advanced to the cecum, which was identified by both the appendix and ileocecal valve. No adverse events experienced.   The quality of the prep was good, using MoviPrep  The instrument was then slowly withdrawn as the colon was fully examined.  COLON FINDINGS: There was diverticulosis noted in the sigmoid colon. The examination was otherwise normal.  Retroflexed views revealed internal Grade II hemorrhoids. The time to cecum=3 minutes 44 seconds.  Withdrawal time=12 minutes 05 seconds.  The scope was withdrawn and the procedure completed. COMPLICATIONS: There were no immediate complications.  ENDOSCOPIC IMPRESSION: 1.   Diverticulosis in the sigmoid colon 2.   Grade Il internal hemorrhoids  RECOMMENDATIONS: 1.  High fiber diet with liberal fluid intake. 2.  Given your age, you will  not need another colonoscopy for colon cancer screening or polyp surveillance.  These types of tests usually stop around the age 73. 3.  Resume Eliquis tomorrow  eSigned:  Meryl DareMalcolm T Neelie Welshans, MD, [facility] 07/19/2014 9:09 AM

## 2014-07-19 NOTE — Progress Notes (Signed)
Procedure ends, to recovery, report givwn and VSS.

## 2014-07-19 NOTE — Patient Instructions (Signed)

## 2014-07-20 ENCOUNTER — Telehealth: Payer: Self-pay

## 2014-07-20 NOTE — Telephone Encounter (Signed)
  Follow up Call-  Call back number 07/19/2014  Post procedure Call Back phone  # [phone]  Permission to leave phone message Yes     Patient questions:  Do you have a fever, pain , or abdominal swelling? No. Pain Score  0 *  Have you tolerated food without any problems? Yes.    Have you been able to return to your normal activities? Yes.    Do you have any questions about your discharge instructions: Diet   No. Medications  No. Follow up visit  No.  Do you have questions or concerns about your Care? No.  Actions: * If pain score is 4 or above: No action needed, pain <4.

## 2014-08-11 ENCOUNTER — Other Ambulatory Visit: Payer: Self-pay | Admitting: Internal Medicine

## 2014-08-14 ENCOUNTER — Encounter: Payer: Self-pay | Admitting: Internal Medicine

## 2014-08-14 ENCOUNTER — Ambulatory Visit (INDEPENDENT_AMBULATORY_CARE_PROVIDER_SITE_OTHER): Payer: Commercial Managed Care - HMO

## 2014-08-14 ENCOUNTER — Ambulatory Visit (INDEPENDENT_AMBULATORY_CARE_PROVIDER_SITE_OTHER): Payer: Commercial Managed Care - HMO | Admitting: Family Medicine

## 2014-08-14 VITALS — BP 112/70 | HR 113 | Temp 99.2°F | Resp 28 | Ht 73.0 in | Wt 245.4 lb

## 2014-08-14 DIAGNOSIS — E119 Type 2 diabetes mellitus without complications: Secondary | ICD-10-CM

## 2014-08-14 DIAGNOSIS — R059 Cough, unspecified: Secondary | ICD-10-CM

## 2014-08-14 DIAGNOSIS — I4891 Unspecified atrial fibrillation: Secondary | ICD-10-CM

## 2014-08-14 DIAGNOSIS — J189 Pneumonia, unspecified organism: Secondary | ICD-10-CM

## 2014-08-14 DIAGNOSIS — R05 Cough: Secondary | ICD-10-CM

## 2014-08-14 DIAGNOSIS — J181 Lobar pneumonia, unspecified organism: Secondary | ICD-10-CM

## 2014-08-14 LAB — POCT CBC
Granulocyte percent: 76.5 %G (ref 37–80)
HEMATOCRIT: 44.7 % (ref 43.5–53.7)
Hemoglobin: 14.7 g/dL (ref 14.1–18.1)
Lymph, poc: 1.3 (ref 0.6–3.4)
MCH, POC: 29.4 pg (ref 27–31.2)
MCHC: 33 g/dL (ref 31.8–35.4)
MCV: 89.1 fL (ref 80–97)
MID (cbc): 0.5 (ref 0–0.9)
MPV: 8.4 fL (ref 0–99.8)
POC Granulocyte: 5.9 (ref 2–6.9)
POC LYMPH %: 16.4 % (ref 10–50)
POC MID %: 7.1 %M (ref 0–12)
Platelet Count, POC: 159 10*3/uL (ref 142–424)
RBC: 5 M/uL (ref 4.69–6.13)
RDW, POC: 13.1 %
WBC: 7.7 10*3/uL (ref 4.6–10.2)

## 2014-08-14 LAB — POCT INFLUENZA A/B
INFLUENZA B, POC: NEGATIVE
Influenza A, POC: NEGATIVE

## 2014-08-14 MED ORDER — LEVOFLOXACIN 500 MG PO TABS: 500.0000 mg | ORAL_TABLET | Freq: Every day | ORAL | Status: DC

## 2014-08-14 MED ORDER — GLIPIZIDE ER 5 MG PO TB24: 5.0000 mg | ORAL_TABLET | Freq: Every day | ORAL | Status: DC

## 2014-08-14 MED ORDER — GLIPIZIDE ER 5 MG PO TB24: 10.0000 mg | ORAL_TABLET | Freq: Every day | ORAL | Status: DC
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
## 2014-08-14 NOTE — Progress Notes (Signed)
Called from [facility], Dr. Chilton SiGreen, regarding Shane Moreno. He presented with URI symptoms, cough and CXR consistent with pneumonia. EKG shows he is back in a-fib with RVR. No symptoms suggestive of unstable angina, but some mild T wave changes (although baseline artifact is present). He is on eliquis and was cardioverted in 02/2014.  I have recommended an increase in metoprolol to 25 mg BID (from 25 mg daily) and to hold his lisinopril for the time being to allow BP room. He should continue Eliquis. Will route message to Dr. Jens Somrenshaw, but would expect that the office contacts him this week for an appointment. He may need repeat cardioversion once he gets over his pneumonia.  Chrystie NoseKenneth C. Emmani Lesueur, MD, [facility] Attending Cardiologist [facility]

## 2014-08-14 NOTE — Patient Instructions (Addendum)
Hold the enalapril for now (i discontinued this on your med list, but can be restarted based on follow up with cardiologist and primary provider.  Increase the metoprolol to 25mg  TWICE per day.  Your cardiologist office should be calling you this week for follow up. Start Levaquin once per day, mucinex if needed for cough.  I decreased the dose of glipizide to 5mg  while you are on Levaquin as this antibiotic can increase the possibility of low blood sugars when combined with glipizide.  Keep a record of your blood sugars outside of the office and bring them to the next office visit. If any low blood sugar readings - return here or emergency room.  Follow up tomorrow here or primary provider.  Return to the clinic or go to the nearest emergency room if any of your symptoms worsen or new symptoms occur.

## 2014-08-15 ENCOUNTER — Telehealth: Payer: Self-pay | Admitting: Internal Medicine

## 2014-08-15 ENCOUNTER — Ambulatory Visit (INDEPENDENT_AMBULATORY_CARE_PROVIDER_SITE_OTHER): Payer: Commercial Managed Care - HMO | Admitting: Family

## 2014-08-15 ENCOUNTER — Encounter: Payer: Self-pay | Admitting: Family

## 2014-08-15 VITALS — BP 126/78 | HR 103 | Temp 98.5°F | Resp 20 | Ht 75.0 in | Wt 248.8 lb

## 2014-08-15 DIAGNOSIS — R062 Wheezing: Secondary | ICD-10-CM

## 2014-08-15 DIAGNOSIS — J189 Pneumonia, unspecified organism: Secondary | ICD-10-CM | POA: Insufficient documentation

## 2014-08-15 MED ORDER — ALBUTEROL SULFATE (2.5 MG/3ML) 0.083% IN NEBU
2.5000 mg | INHALATION_SOLUTION | Freq: Once | RESPIRATORY_TRACT | Status: AC
  Administered 2014-08-15: 2.5 mg via RESPIRATORY_TRACT

## 2014-08-15 NOTE — Assessment & Plan Note (Signed)
Currently stable. Continue Levaquin at prescribed dose. Continue over-the-counter medications as needed for symptom relief. In office albuterol treatment given- patient indicates that there was not much difference noted post treatment. Discussed possibility of adding steroid if needed. We'll hold off at this time. Instructed to rest and drink plenty of fluids. Follow up if symptoms worsen or fail to improve. All patient questions were answered and patient is in agreement with plan.

## 2014-08-15 NOTE — Progress Notes (Signed)
Pre visit review using our clinic review tool, if applicable. No additional management support is needed unless otherwise documented below in the visit note. 

## 2014-08-15 NOTE — Patient Instructions (Signed)
Thank you for choosing [facility].  Summary/Instructions:  If your symptoms worsen or fail to improve, please contact our office for further instruction, or in case of emergency go directly to the emergency room at the closest medical facility.   Continue current medications as prescribed.  Pneumonia Pneumonia is an infection of the lungs.  CAUSES Pneumonia may be caused by bacteria or a virus. Usually, these infections are caused by breathing infectious particles into the lungs (respiratory tract). SIGNS AND SYMPTOMS   Cough.  Fever.  Chest pain.  Increased rate of breathing.  Wheezing.  Mucus production. DIAGNOSIS  If you have the common symptoms of pneumonia, your health care provider will typically confirm the diagnosis with a chest X-ray. The X-ray will show an abnormality in the lung (pulmonary infiltrate) if you have pneumonia. Other tests of your blood, urine, or sputum may be done to find the specific cause of your pneumonia. Your health care provider may also do tests (blood gases or pulse oximetry) to see how well your lungs are working. TREATMENT  Some forms of pneumonia may be spread to other people when you cough or sneeze. You may be asked to wear a mask before and during your exam. Pneumonia that is caused by bacteria is treated with antibiotic medicine. Pneumonia that is caused by the influenza virus may be treated with an antiviral medicine. Most other viral infections must run their course. These infections will not respond to antibiotics.  HOME CARE INSTRUCTIONS   Cough suppressants may be used if you are losing too much rest. However, coughing protects you by clearing your lungs. You should avoid using cough suppressants if you can.  Your health care provider may have prescribed medicine if he or she thinks your pneumonia is caused by bacteria or influenza. Finish your medicine even if you start to feel better.  Your health care provider may also  prescribe an expectorant. This loosens the mucus to be coughed up.  Take medicines only as directed by your health care provider.  Do not smoke. Smoking is a common cause of bronchitis and can contribute to pneumonia. If you are a smoker and continue to smoke, your cough may last several weeks after your pneumonia has cleared.  A cold steam vaporizer or humidifier in your room or home may help loosen mucus.  Coughing is often worse at night. Sleeping in a semi-upright position in a recliner or using a couple pillows under your head will help with this.  Get rest as you feel it is needed. Your body will usually let you know when you need to rest. PREVENTION A pneumococcal shot (vaccine) is available to prevent a common bacterial cause of pneumonia. This is usually suggested for:  People over 588 years old.  Patients on chemotherapy.  People with chronic lung problems, such as bronchitis or emphysema.  People with immune system problems. If you are over 65 or have a high risk condition, you may receive the pneumococcal vaccine if you have not received it before. In some countries, a routine influenza vaccine is also recommended. This vaccine can help prevent some cases of pneumonia.You may be offered the influenza vaccine as part of your care. If you smoke, it is time to quit. You may receive instructions on how to stop smoking. Your health care provider can provide medicines and counseling to help you quit. SEEK MEDICAL CARE IF: You have a fever. SEEK IMMEDIATE MEDICAL CARE IF:   Your illness becomes worse. This is  especially true if you are elderly or weakened from any other disease.  You cannot control your cough with suppressants and are losing sleep.  You begin coughing up blood.  You develop pain which is getting worse or is uncontrolled with medicines.  Any of the symptoms which initially brought you in for treatment are getting worse rather than better.  You develop  shortness of breath or chest pain. MAKE SURE YOU:   Understand these instructions.  Will watch your condition.  Will get help right away if you are not doing well or get worse. Document Released: 08/26/2005 Document Revised: 01/10/2014 Document Reviewed: 11/15/2010 [facility] Patient Information 2015 [employer] [place] [place]. This information is not intended to replace advice given to you by your health care provider. Make sure you discuss any questions you have with your health care provider.

## 2014-08-15 NOTE — Progress Notes (Signed)
   Subjective:    Patient ID: Shane Moreno, male    DOB: 03/27/1941, 73 y.o.   MRN: [id number]  Chief Complaint  Patient presents with  . Follow-up    was diagnosed with pneumonia and bronchitis yesterday and was told to be seen today    HPI:  Shane Moreno is a 73 y.o. male who presents today for an acute visit.  Was recently seen at urgent care and diagnosed with pneumonia and bronchitis. It was suggested the patient follow up in primary care office. He indicates today that he is feeling better following a day of Levaquin. He is present today with his wife with questions regarding pneumonia.   Urgent care notes and images were reviewed in detail.  No Known Allergies  Current Outpatient Prescriptions on File Prior to Visit  Medication Sig Dispense Refill  . apixaban (ELIQUIS) 5 MG TABS tablet Take 1 tablet (5 mg total) by mouth 2 (two) times daily. 60 tablet 6  . Cholecalciferol (VITAMIN D) 1000 UNITS capsule Take 1,000 Units by mouth daily.    . Coenzyme Q10 (CO Q 10) 100 MG CAPS Take 100 mg by mouth daily.    Marland Kitchen. glipiZIDE (GLUCOTROL XL) 5 MG 24 hr tablet Take 1 tablet (5 mg total) by mouth daily with breakfast. 30 tablet 0  . glucose blood (TRUETEST TEST) test strip Use as instructed 100 each 3  . Lancets (FREESTYLE) lancets Use as directed once per day 100 each 3  . levofloxacin (LEVAQUIN) 500 MG tablet Take 1 tablet (500 mg total) by mouth daily. 10 tablet 0  . metFORMIN (GLUCOPHAGE) 1000 MG tablet TAKE 1 TABLET TWICE DAILY 180 tablet 3  . metoprolol tartrate (LOPRESSOR) 25 MG tablet TAKE 1 TABLET EVERY DAY 90 tablet 3  . simvastatin (ZOCOR) 40 MG tablet Take 1 tablet (40 mg total) by mouth every evening. 90 tablet 3   No current facility-administered medications on file prior to visit.   Review of Systems    See HPI Objective:    BP 126/78 mmHg  Pulse 103  Temp(Src) 98.5 F (36.9 C) (Oral)  Resp 20  Ht 6\' 3"  (1.905 m)  Wt 248 lb 12.8 oz (112.855 kg)  BMI 31.10  kg/m2  SpO2 95% Nursing note and vital signs reviewed.  Physical Exam  Constitutional: He is oriented to person, place, and time. He appears well-developed and well-nourished. No distress.  HENT:  Right Ear: Hearing, tympanic membrane, external ear and ear canal normal.  Left Ear: Hearing, tympanic membrane, external ear and ear canal normal.  Nose: Nose normal.  Mouth/Throat: Uvula is midline, oropharynx is clear and moist and mucous membranes are normal.  Cardiovascular: Normal rate, regular rhythm, normal heart sounds and intact distal pulses.   Pulmonary/Chest: Effort normal. He has wheezes.  Neurological: He is alert and oriented to person, place, and time.  Skin: Skin is warm and dry.  Psychiatric: He has a normal mood and affect. His behavior is normal. Judgment and thought content normal.       Assessment & Plan:

## 2014-08-24 ENCOUNTER — Ambulatory Visit (INDEPENDENT_AMBULATORY_CARE_PROVIDER_SITE_OTHER): Payer: Commercial Managed Care - HMO | Admitting: Emergency Medicine

## 2014-08-24 ENCOUNTER — Ambulatory Visit (INDEPENDENT_AMBULATORY_CARE_PROVIDER_SITE_OTHER): Payer: Commercial Managed Care - HMO

## 2014-08-24 VITALS — BP 122/74 | HR 95 | Temp 98.5°F | Resp 17 | Ht 74.0 in | Wt 242.0 lb

## 2014-08-24 DIAGNOSIS — E119 Type 2 diabetes mellitus without complications: Secondary | ICD-10-CM

## 2014-08-24 DIAGNOSIS — J189 Pneumonia, unspecified organism: Secondary | ICD-10-CM

## 2014-08-24 DIAGNOSIS — I4891 Unspecified atrial fibrillation: Secondary | ICD-10-CM

## 2014-08-24 DIAGNOSIS — J181 Lobar pneumonia, unspecified organism: Principal | ICD-10-CM

## 2014-08-24 NOTE — Patient Instructions (Signed)
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
## 2014-09-08 NOTE — Telephone Encounter (Signed)
NA

## 2014-09-28 ENCOUNTER — Other Ambulatory Visit: Payer: Self-pay | Admitting: Cardiology

## 2014-12-23 ENCOUNTER — Encounter: Payer: Self-pay | Admitting: Internal Medicine

## 2014-12-23 ENCOUNTER — Other Ambulatory Visit (INDEPENDENT_AMBULATORY_CARE_PROVIDER_SITE_OTHER): Payer: Commercial Managed Care - HMO

## 2014-12-23 ENCOUNTER — Ambulatory Visit (INDEPENDENT_AMBULATORY_CARE_PROVIDER_SITE_OTHER): Payer: Commercial Managed Care - HMO | Admitting: Internal Medicine

## 2014-12-23 VITALS — BP 124/86 | HR 80 | Temp 97.7°F | Resp 18 | Ht 74.0 in | Wt 251.0 lb

## 2014-12-23 DIAGNOSIS — Z0000 Encounter for general adult medical examination without abnormal findings: Secondary | ICD-10-CM | POA: Diagnosis not present

## 2014-12-23 DIAGNOSIS — E119 Type 2 diabetes mellitus without complications: Secondary | ICD-10-CM

## 2014-12-23 DIAGNOSIS — R7989 Other specified abnormal findings of blood chemistry: Secondary | ICD-10-CM

## 2014-12-23 DIAGNOSIS — Z125 Encounter for screening for malignant neoplasm of prostate: Secondary | ICD-10-CM | POA: Diagnosis not present

## 2014-12-23 LAB — CBC WITH DIFFERENTIAL/PLATELET
BASOS ABS: 0 10*3/uL (ref 0.0–0.1)
Basophils Relative: 0.6 % (ref 0.0–3.0)
EOS PCT: 2.8 % (ref 0.0–5.0)
Eosinophils Absolute: 0.2 10*3/uL (ref 0.0–0.7)
HCT: 44.5 % (ref 39.0–52.0)
HEMOGLOBIN: 15.6 g/dL (ref 13.0–17.0)
Lymphocytes Relative: 27.7 % (ref 12.0–46.0)
Lymphs Abs: 1.6 10*3/uL (ref 0.7–4.0)
MCHC: 35 g/dL (ref 30.0–36.0)
MCV: 86.8 fl (ref 78.0–100.0)
MONO ABS: 0.6 10*3/uL (ref 0.1–1.0)
Monocytes Relative: 10.4 % (ref 3.0–12.0)
NEUTROS PCT: 58.5 % (ref 43.0–77.0)
Neutro Abs: 3.4 10*3/uL (ref 1.4–7.7)
PLATELETS: 153 10*3/uL (ref 150.0–400.0)
RBC: 5.13 Mil/uL (ref 4.22–5.81)
RDW: 13.5 % (ref 11.5–15.5)
WBC: 5.9 10*3/uL (ref 4.0–10.5)

## 2014-12-23 LAB — HEPATIC FUNCTION PANEL
ALBUMIN: 4.4 g/dL (ref 3.5–5.2)
ALK PHOS: 51 U/L (ref 39–117)
ALT: 18 U/L (ref 0–53)
AST: 17 U/L (ref 0–37)
Bilirubin, Direct: 0.1 mg/dL (ref 0.0–0.3)
Total Bilirubin: 0.6 mg/dL (ref 0.2–1.2)
Total Protein: 7.2 g/dL (ref 6.0–8.3)

## 2014-12-23 LAB — URINALYSIS, ROUTINE W REFLEX MICROSCOPIC
Bilirubin Urine: NEGATIVE
Hgb urine dipstick: NEGATIVE
KETONES UR: NEGATIVE
Leukocytes, UA: NEGATIVE
Nitrite: NEGATIVE
RBC / HPF: NONE SEEN (ref 0–?)
SPECIFIC GRAVITY, URINE: 1.02 (ref 1.000–1.030)
Total Protein, Urine: NEGATIVE
Urine Glucose: NEGATIVE
Urobilinogen, UA: 0.2 (ref 0.0–1.0)
pH: 6 (ref 5.0–8.0)

## 2014-12-23 LAB — BASIC METABOLIC PANEL
BUN: 23 mg/dL (ref 6–23)
CALCIUM: 9.6 mg/dL (ref 8.4–10.5)
CO2: 29 mEq/L (ref 19–32)
Chloride: 102 mEq/L (ref 96–112)
Creatinine, Ser: 0.86 mg/dL (ref 0.40–1.50)
GFR: 92.64 mL/min (ref >60.00)
Glucose, Bld: 172 mg/dL — ABNORMAL HIGH (ref 70–99)
Potassium: 4.5 mEq/L (ref 3.5–5.1)
Sodium: 137 mEq/L (ref 135–145)

## 2014-12-23 LAB — LIPID PANEL
CHOL/HDL RATIO: 5
Cholesterol: 205 mg/dL — ABNORMAL HIGH (ref 0–200)
HDL: 42.2 mg/dL (ref >39.00)
NonHDL: 162.8
TRIGLYCERIDES: 304 mg/dL — AB (ref 0.0–149.0)
VLDL: 60.8 mg/dL — ABNORMAL HIGH (ref 0.0–40.0)

## 2014-12-23 LAB — HEMOGLOBIN A1C: Hgb A1c MFr Bld: 8.2 % — ABNORMAL HIGH (ref 4.6–6.5)

## 2014-12-23 LAB — MICROALBUMIN / CREATININE URINE RATIO
Creatinine,U: 115.2 mg/dL
MICROALB UR: 9.4 mg/dL — AB (ref 0.0–1.9)
Microalb Creat Ratio: 8.2 mg/g (ref 0.0–30.0)

## 2014-12-23 LAB — TSH: TSH: 2.1 u[IU]/mL (ref 0.35–4.50)

## 2014-12-23 LAB — LDL CHOLESTEROL, DIRECT: Direct LDL: 125 mg/dL

## 2014-12-23 LAB — PSA: PSA: 0.62 ng/mL (ref 0.10–4.00)

## 2014-12-23 MED ORDER — APIXABAN 5 MG PO TABS: 5.0000 mg | ORAL_TABLET | Freq: Two times a day (BID) | ORAL | Status: DC

## 2014-12-23 NOTE — Patient Instructions (Signed)
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
## 2014-12-23 NOTE — Progress Notes (Signed)
Pre visit review using our clinic review tool, if applicable. No additional management support is needed unless otherwise documented below in the visit note. 

## 2014-12-24 NOTE — Assessment & Plan Note (Signed)
stable overall by history and exam, recent data reviewed with pt, and pt to continue medical treatment as before,  to f/u any worsening symptoms or concerns Lab Results  Component Value Date   HGBA1C 8.2* 12/23/2014

## 2014-12-24 NOTE — Assessment & Plan Note (Signed)

## 2014-12-28 ENCOUNTER — Encounter: Payer: Self-pay | Admitting: Internal Medicine

## 2014-12-28 ENCOUNTER — Other Ambulatory Visit: Payer: Self-pay | Admitting: Internal Medicine

## 2014-12-28 MED ORDER — GLIPIZIDE ER 10 MG PO TB24: 10.0000 mg | ORAL_TABLET | Freq: Every day | ORAL | Status: DC

## 2014-12-28 MED ORDER — ATORVASTATIN CALCIUM 40 MG PO TABS: 40.0000 mg | ORAL_TABLET | Freq: Every day | ORAL | Status: DC

## 2015-01-30 ENCOUNTER — Other Ambulatory Visit: Payer: Self-pay | Admitting: Internal Medicine
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
## 2015-02-07 ENCOUNTER — Ambulatory Visit (INDEPENDENT_AMBULATORY_CARE_PROVIDER_SITE_OTHER): Payer: Commercial Managed Care - HMO | Admitting: Cardiology

## 2015-02-07 ENCOUNTER — Encounter: Payer: Self-pay | Admitting: Cardiology

## 2015-02-07 VITALS — BP 124/78 | HR 79 | Ht 75.0 in | Wt 258.0 lb

## 2015-02-07 DIAGNOSIS — I482 Chronic atrial fibrillation, unspecified: Secondary | ICD-10-CM

## 2015-02-07 DIAGNOSIS — E785 Hyperlipidemia, unspecified: Secondary | ICD-10-CM | POA: Diagnosis not present

## 2015-02-07 DIAGNOSIS — I10 Essential (primary) hypertension: Secondary | ICD-10-CM

## 2015-02-07 MED ORDER — ATORVASTATIN CALCIUM 80 MG PO TABS: 80.0000 mg | ORAL_TABLET | Freq: Every day | ORAL | Status: DC

## 2015-02-07 MED ORDER — APIXABAN 5 MG PO TABS: 5.0000 mg | ORAL_TABLET | Freq: Two times a day (BID) | ORAL | Status: DC

## 2015-02-07 MED ORDER — METOPROLOL TARTRATE 25 MG PO TABS: 25.0000 mg | ORAL_TABLET | Freq: Every day | ORAL | Status: DC

## 2015-02-07 NOTE — Assessment & Plan Note (Signed)
Continue statin. Not on aspirin given need for anticoagulation. 

## 2015-02-07 NOTE — Assessment & Plan Note (Signed)
Blood pressure controlled. Continue present medications. 

## 2015-02-07 NOTE — Assessment & Plan Note (Signed)
Patient has developed recurrent atrial fibrillation.however he is asymptomatic. We will therefore plan rate control and anticoagulation. Continue Toprol. Continue apixaban. He will need his hemoglobin and renal function checked every 6 months.

## 2015-02-07 NOTE — Patient Instructions (Addendum)
Your physician wants you to follow-up in: ONE YEAR WITH DR CRENSHAW You will receive a reminder letter in the mail two months in advance. If you don't receive a letter, please call our office to schedule the follow-up appointment.   INCREASE ATORVASTATIN TO 80 MG ONCE DAILY= 2 OF THE 40 MG TABLETS ONCE DAILY  Your physician recommends that you return for lab work in: 4 WEEKS= DO NOT EAT PRIOR TO LAB WORK 

## 2015-02-07 NOTE — Addendum Note (Signed)
Addended by: Freddi StarrMATHIS, DEBRA W on: 02/07/2015 09:21 AM   Modules accepted: Orders

## 2015-02-07 NOTE — Assessment & Plan Note (Signed)
Given documented coronary artery disease increase Lipitor to 80 mg daily. Check lipids and liver in 4 weeks. 

## 2015-03-03 ENCOUNTER — Telehealth: Payer: Self-pay | Admitting: Internal Medicine

## 2015-03-03 NOTE — Telephone Encounter (Signed)
rx faxed to [facility]

## 2015-03-03 NOTE — Telephone Encounter (Signed)
Pt called in again, and upset because no one has called him back.  Can you call him when u get a chance.  I am sorry , thank you !!!

## 2015-03-03 NOTE — Telephone Encounter (Signed)
Done hardcopy to cynthia 

## 2015-03-03 NOTE — Telephone Encounter (Signed)
Pt states that [facility] sent him truemetric test strips instead of truetest.  He was wondering if Dr Jonny Ruiz would approve for him to get a truemetric meter so he does not have to pay for another set of test strips.  Please advise if okay.  This type of meter is not in EPIC so it would have to be written out to fax.  Thanks!

## 2015-03-03 NOTE — Telephone Encounter (Signed)
Pt called and said that his Glucoses meter was called in to his mail order pharmacy but they said that they could not fill it because they could not verify it?    Best number [phone]

## 2015-03-10 ENCOUNTER — Encounter: Payer: Self-pay | Admitting: *Deleted

## 2015-03-10 ENCOUNTER — Other Ambulatory Visit (INDEPENDENT_AMBULATORY_CARE_PROVIDER_SITE_OTHER): Payer: Commercial Managed Care - HMO

## 2015-03-10 DIAGNOSIS — E785 Hyperlipidemia, unspecified: Secondary | ICD-10-CM | POA: Diagnosis not present

## 2015-03-10 LAB — LIPID PANEL
CHOL/HDL RATIO: 3
Cholesterol: 78 mg/dL (ref 0–200)
HDL: 30.4 mg/dL — ABNORMAL LOW (ref >39.00)
LDL CALC: 28 mg/dL (ref 0–99)
NonHDL: 47.6
Triglycerides: 99 mg/dL (ref 0.0–149.0)
VLDL: 19.8 mg/dL (ref 0.0–40.0)

## 2015-03-10 LAB — HEPATIC FUNCTION PANEL
ALBUMIN: 4.5 g/dL (ref 3.5–5.2)
ALT: 16 U/L (ref 0–53)
AST: 17 U/L (ref 0–37)
Alkaline Phosphatase: 48 U/L (ref 39–117)
Bilirubin, Direct: 0.2 mg/dL (ref 0.0–0.3)
Total Bilirubin: 0.8 mg/dL (ref 0.2–1.2)
Total Protein: 7.7 g/dL (ref 6.0–8.3)

## 2015-03-22 ENCOUNTER — Ambulatory Visit (INDEPENDENT_AMBULATORY_CARE_PROVIDER_SITE_OTHER): Payer: Commercial Managed Care - HMO

## 2015-03-22 ENCOUNTER — Ambulatory Visit (INDEPENDENT_AMBULATORY_CARE_PROVIDER_SITE_OTHER): Payer: Commercial Managed Care - HMO | Admitting: Family Medicine

## 2015-03-22 VITALS — BP 118/66 | HR 64 | Temp 98.2°F | Resp 16 | Ht 72.0 in | Wt 245.2 lb

## 2015-03-22 DIAGNOSIS — R05 Cough: Secondary | ICD-10-CM

## 2015-03-22 DIAGNOSIS — R059 Cough, unspecified: Secondary | ICD-10-CM

## 2015-03-22 DIAGNOSIS — J988 Other specified respiratory disorders: Secondary | ICD-10-CM | POA: Diagnosis not present

## 2015-03-22 DIAGNOSIS — I482 Chronic atrial fibrillation, unspecified: Secondary | ICD-10-CM

## 2015-03-22 DIAGNOSIS — J22 Unspecified acute lower respiratory infection: Secondary | ICD-10-CM

## 2015-03-22 MED ORDER — BENZONATATE 100 MG PO CAPS: 200.0000 mg | ORAL_CAPSULE | Freq: Two times a day (BID) | ORAL | Status: DC | PRN

## 2015-03-22 MED ORDER — LEVOFLOXACIN 500 MG PO TABS: 500.0000 mg | ORAL_TABLET | Freq: Every day | ORAL | Status: DC

## 2015-03-22 NOTE — Patient Instructions (Signed)
Acute Bronchitis °Bronchitis is inflammation of the airways that extend from the windpipe into the lungs (bronchi). The inflammation often causes mucus to develop. This leads to a cough, which is the most common symptom of bronchitis.  °In acute bronchitis, the condition usually develops suddenly and goes away over time, usually in a couple weeks. Smoking, allergies, and asthma can make bronchitis worse. Repeated episodes of bronchitis may cause further lung problems.  °CAUSES °Acute bronchitis is most often caused by the same virus that causes a cold. The virus can spread from person to person (contagious) through coughing, sneezing, and touching contaminated objects. °SIGNS AND SYMPTOMS  °· Cough.   °· Fever.   °· Coughing up mucus.   °· Body aches.   °· Chest congestion.   °· Chills.   °· Shortness of breath.   °· Sore throat.   °DIAGNOSIS  °Acute bronchitis is usually diagnosed through a physical exam. Your health care provider will also ask you questions about your medical history. Tests, such as chest X-rays, are sometimes done to rule out other conditions.  °TREATMENT  °Acute bronchitis usually goes away in a couple weeks. Oftentimes, no medical treatment is necessary. Medicines are sometimes given for relief of fever or cough. Antibiotic medicines are usually not needed but may be prescribed in certain situations. In some cases, an inhaler may be recommended to help reduce shortness of breath and control the cough. A cool mist vaporizer may also be used to help thin bronchial secretions and make it easier to clear the chest.  °HOME CARE INSTRUCTIONS °· Get plenty of rest.   °· Drink enough fluids to keep your urine clear or pale yellow (unless you have a medical condition that requires fluid restriction). Increasing fluids may help thin your respiratory secretions (sputum) and reduce chest congestion, and it will prevent dehydration.   °· Take medicines only as directed by your health care provider. °· If  you were prescribed an antibiotic medicine, finish it all even if you start to feel better. °· Avoid smoking and secondhand smoke. Exposure to cigarette smoke or irritating chemicals will make bronchitis worse. If you are a smoker, consider using nicotine gum or skin patches to help control withdrawal symptoms. Quitting smoking will help your lungs heal faster.   °· Reduce the chances of another bout of acute bronchitis by washing your hands frequently, avoiding people with cold symptoms, and trying not to touch your hands to your mouth, nose, or eyes.   °· Keep all follow-up visits as directed by your health care provider.   °SEEK MEDICAL CARE IF: °Your symptoms do not improve after 1 week of treatment.  °SEEK IMMEDIATE MEDICAL CARE IF: °· You develop an increased fever or chills.   °· You have chest pain.   °· You have severe shortness of breath. °· You have bloody sputum.   °· You develop dehydration. °· You faint or repeatedly feel like you are going to pass out. °· You develop repeated vomiting. °· You develop a severe headache. °MAKE SURE YOU:  °· Understand these instructions. °· Will watch your condition. °· Will get help right away if you are not doing well or get worse. °Document Released: 10/03/2004 Document Revised: 01/10/2014 Document Reviewed: 02/16/2013 °ExitCare® Patient Information ©2015 [employer]. This information is not intended to replace advice given to you by your health care provider. Make sure you discuss any questions you have with your health care provider. ° °
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
## 2015-07-04 ENCOUNTER — Encounter: Payer: Self-pay | Admitting: Internal Medicine

## 2015-07-04 ENCOUNTER — Telehealth: Payer: Self-pay | Admitting: Internal Medicine

## 2015-07-04 ENCOUNTER — Other Ambulatory Visit (INDEPENDENT_AMBULATORY_CARE_PROVIDER_SITE_OTHER): Payer: Commercial Managed Care - HMO

## 2015-07-04 ENCOUNTER — Ambulatory Visit (INDEPENDENT_AMBULATORY_CARE_PROVIDER_SITE_OTHER): Payer: Commercial Managed Care - HMO | Admitting: Internal Medicine

## 2015-07-04 VITALS — BP 124/84 | HR 64 | Temp 98.6°F | Ht 72.0 in | Wt 253.0 lb

## 2015-07-04 DIAGNOSIS — I10 Essential (primary) hypertension: Secondary | ICD-10-CM

## 2015-07-04 DIAGNOSIS — Z23 Encounter for immunization: Secondary | ICD-10-CM

## 2015-07-04 DIAGNOSIS — Z0189 Encounter for other specified special examinations: Secondary | ICD-10-CM

## 2015-07-04 DIAGNOSIS — Z0000 Encounter for general adult medical examination without abnormal findings: Secondary | ICD-10-CM

## 2015-07-04 DIAGNOSIS — E119 Type 2 diabetes mellitus without complications: Secondary | ICD-10-CM

## 2015-07-04 DIAGNOSIS — E785 Hyperlipidemia, unspecified: Secondary | ICD-10-CM

## 2015-07-04 LAB — BASIC METABOLIC PANEL
BUN: 16 mg/dL (ref 6–23)
CHLORIDE: 101 meq/L (ref 96–112)
CO2: 29 meq/L (ref 19–32)
CREATININE: 0.94 mg/dL (ref 0.40–1.50)
Calcium: 9.7 mg/dL (ref 8.4–10.5)
GFR: 83.48 mL/min (ref >60.00)
Glucose, Bld: 220 mg/dL — ABNORMAL HIGH (ref 70–99)
Potassium: 4.7 mEq/L (ref 3.5–5.1)
Sodium: 137 mEq/L (ref 135–145)

## 2015-07-04 LAB — HEMOGLOBIN A1C: Hgb A1c MFr Bld: 8.8 % — ABNORMAL HIGH (ref 4.6–6.5)

## 2015-07-04 MED ORDER — PIOGLITAZONE HCL 15 MG PO TABS: 15.0000 mg | ORAL_TABLET | Freq: Every day | ORAL | Status: DC
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
## 2015-07-04 NOTE — Progress Notes (Signed)
Pre visit review using our clinic review tool, if applicable. No additional management support is needed unless otherwise documented below in the visit note. 

## 2015-07-04 NOTE — Patient Instructions (Signed)

## 2015-07-04 NOTE — Assessment & Plan Note (Signed)
Improved on current statin per cardiology -  Lab Results  Component Value Date   LDLCALC 28 03/10/2015

## 2015-07-04 NOTE — Assessment & Plan Note (Signed)
stable overall by history and exam, recent data reviewed with pt, and pt to continue medical treatment as before,  to f/u any worsening symptoms or concerns BP Readings from Last 3 Encounters:  07/04/15 124/84  03/22/15 118/66  02/07/15 124/78

## 2015-07-04 NOTE — Assessment & Plan Note (Signed)
stable overall by history and exam, recent data reviewed with pt, and pt to continue medical treatment as before,  to f/u any worsening symptoms or concerns Lab Results  Component Value Date   HGBA1C 8.2* 12/23/2014   For f/u lab with increased OHA since April 2016 last visit

## 2015-07-06 MED ORDER — PIOGLITAZONE HCL 15 MG PO TABS: 15.0000 mg | ORAL_TABLET | Freq: Every day | ORAL | Status: DC

## 2015-07-13 NOTE — Telephone Encounter (Signed)
Called [facility] spoke with Don/pharmacist he cancel actos rx...Raechel Chute/lmb

## 2015-07-13 NOTE — Telephone Encounter (Signed)
Patient is advising that his actos can not be mailed from [facility] until the script sent to [facility] incorrectly is cancelled. Please cancel the script to [facility] so that [facility] can mail him the RX.

## 2015-09-19 ENCOUNTER — Other Ambulatory Visit: Payer: Self-pay | Admitting: Internal Medicine

## 2015-11-14 ENCOUNTER — Ambulatory Visit (INDEPENDENT_AMBULATORY_CARE_PROVIDER_SITE_OTHER): Payer: Commercial Managed Care - HMO | Admitting: Emergency Medicine

## 2015-11-14 ENCOUNTER — Ambulatory Visit (INDEPENDENT_AMBULATORY_CARE_PROVIDER_SITE_OTHER): Payer: Commercial Managed Care - HMO

## 2015-11-14 VITALS — BP 108/64 | HR 108 | Temp 97.9°F | Resp 18 | Ht 74.0 in | Wt 245.0 lb

## 2015-11-14 DIAGNOSIS — R05 Cough: Secondary | ICD-10-CM

## 2015-11-14 DIAGNOSIS — J189 Pneumonia, unspecified organism: Secondary | ICD-10-CM | POA: Diagnosis not present

## 2015-11-14 DIAGNOSIS — R059 Cough, unspecified: Secondary | ICD-10-CM

## 2015-11-14 DIAGNOSIS — E119 Type 2 diabetes mellitus without complications: Secondary | ICD-10-CM

## 2015-11-14 LAB — POCT CBC
Granulocyte percent: 74.5 %G (ref 37–80)
HCT, POC: 42 % — AB (ref 43.5–53.7)
HEMOGLOBIN: 14.8 g/dL (ref 14.1–18.1)
Lymph, poc: 1.6 (ref 0.6–3.4)
MCH: 29.9 pg (ref 27–31.2)
MCHC: 35.2 g/dL (ref 31.8–35.4)
MCV: 84.9 fL (ref 80–97)
MID (cbc): 0.7 (ref 0–0.9)
MPV: 7.9 fL (ref 0–99.8)
PLATELET COUNT, POC: 182 10*3/uL (ref 142–424)
POC Granulocyte: 6.8 (ref 2–6.9)
POC LYMPH PERCENT: 18.1 %L (ref 10–50)
POC MID %: 7.4 %M (ref 0–12)
RBC: 4.95 M/uL (ref 4.69–6.13)
RDW, POC: 13.2 %
WBC: 9.1 10*3/uL (ref 4.6–10.2)

## 2015-11-14 LAB — GLUCOSE, POCT (MANUAL RESULT ENTRY): POC GLUCOSE: 232 mg/dL — AB (ref 70–99)

## 2015-11-14 MED ORDER — PREDNISONE 10 MG PO TABS: ORAL_TABLET | ORAL | Status: DC

## 2015-11-14 MED ORDER — AZITHROMYCIN 250 MG PO TABS: ORAL_TABLET | ORAL | Status: DC

## 2015-11-14 NOTE — Patient Instructions (Addendum)
Because you received an x-ray today, you will receive an invoice from [facility]. Please contact [facility] at [phone] with questions or concerns regarding your invoice. Our billing staff will not be able to assist you with those questions.    Community-Acquired Pneumonia, Adult Pneumonia is an infection of the lungs. There are different types of pneumonia. One type can develop while a person is in a hospital. A different type, called community-acquired pneumonia, develops in people who are not, or have not recently been, in the hospital or other health care facility.  CAUSES Pneumonia may be caused by bacteria, viruses, or funguses. Community-acquired pneumonia is often caused by Streptococcus pneumonia bacteria. These bacteria are often passed from one person to another by breathing in droplets from the cough or sneeze of an infected person. RISK FACTORS The condition is more likely to develop in:  People who havechronic diseases, such as chronic obstructive pulmonary disease (COPD), asthma, congestive heart failure, cystic fibrosis, diabetes, or kidney disease.  People who haveearly-stage or late-stage HIV.  People who havesickle cell disease.  People who havehad their spleen removed (splenectomy).  People who havepoor dental hygiene.  People who havemedical conditions that increase the risk of breathing in (aspirating) secretions their own mouth and nose.   People who havea weakened immune system (immunocompromised).  People who smoke.  People whotravel to areas where pneumonia-causing germs commonly exist.  People whoare around animal habitats or animals that have pneumonia-causing germs, including birds, bats, rabbits, cats, and farm animals. SYMPTOMS Symptoms of this condition include:  Adry cough.  A wet (productive) cough.  Fever.  Sweating.  Chest pain, especially when breathing deeply or coughing.  Rapid breathing or difficulty  breathing.  Shortness of breath.  Shaking chills.  Fatigue.  Muscle aches. DIAGNOSIS Your health care provider will take a medical history and perform a physical exam. You may also have other tests, including:  Imaging studies of your chest, including X-rays.  Tests to check your blood oxygen level and other blood gases.  Other tests on blood, mucus (sputum), fluid around your lungs (pleural fluid), and urine. If your pneumonia is severe, other tests may be done to identify the specific cause of your illness. TREATMENT The type of treatment that you receive depends on many factors, such as the cause of your pneumonia, the medicines you take, and other medical conditions that you have. For most adults, treatment and recovery from pneumonia may occur at home. In some cases, treatment must happen in a hospital. Treatment may include:  Antibiotic medicines, if the pneumonia was caused by bacteria.  Antiviral medicines, if the pneumonia was caused by a virus.  Medicines that are given by mouth or through an IV tube.  Oxygen.  Respiratory therapy. Although rare, treating severe pneumonia may include:  Mechanical ventilation. This is done if you are not breathing well on your own and you cannot maintain a safe blood oxygen level.  Thoracentesis. This procedureremoves fluid around one lung or both lungs to help you breathe better. HOME CARE INSTRUCTIONS  Take over-the-counter and prescription medicines only as told by your health care provider.  Only takecough medicine if you are losing sleep. Understand that cough medicine can prevent your body's natural ability to remove mucus from your lungs.  If you were prescribed an antibiotic medicine, take it as told by your health care provider. Do not stop taking the antibiotic even if you start to feel better.  Sleep in a semi-upright position at night. Try   sleeping in a reclining chair, or place a few pillows under your head.  Do  not use tobacco products, including cigarettes, chewing tobacco, and e-cigarettes. If you need help quitting, ask your health care provider.  Drink enough water to keep your urine clear or pale yellow. This will help to thin out mucus secretions in your lungs. PREVENTION There are ways that you can decrease your risk of developing community-acquired pneumonia. Consider getting a pneumococcal vaccine if:  You are older than 74 years of age.  You are older than 74 years of age and are undergoing cancer treatment, have chronic lung disease, or have other medical conditions that affect your immune system. Ask your health care provider if this applies to you. There are different types and schedules of pneumococcal vaccines. Ask your health care provider which vaccination option is best for you. You may also prevent community-acquired pneumonia if you take these actions:  Get an influenza vaccine every year. Ask your health care provider which type of influenza vaccine is best for you.  Go to the dentist on a regular basis.  Wash your hands often. Use hand sanitizer if soap and water are not available. SEEK MEDICAL CARE IF:  You have a fever.  You are losing sleep because you cannot control your cough with cough medicine. SEEK IMMEDIATE MEDICAL CARE IF:  You have worsening shortness of breath.  You have increased chest pain.  Your sickness becomes worse, especially if you are an older adult or have a weakened immune system.  You cough up blood.   This information is not intended to replace advice given to you by your health care provider. Make sure you discuss any questions you have with your health care provider.   Document Released: 08/26/2005 Document Revised: 05/17/2015 Document Reviewed: 12/21/2014 Elsevier Interactive Patient Education 2016 [employer]   
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
## 2015-11-15 ENCOUNTER — Other Ambulatory Visit: Payer: Self-pay | Admitting: Internal Medicine

## 2016-01-02 ENCOUNTER — Other Ambulatory Visit (INDEPENDENT_AMBULATORY_CARE_PROVIDER_SITE_OTHER): Payer: Commercial Managed Care - HMO

## 2016-01-02 ENCOUNTER — Encounter: Payer: Self-pay | Admitting: Internal Medicine

## 2016-01-02 ENCOUNTER — Ambulatory Visit (INDEPENDENT_AMBULATORY_CARE_PROVIDER_SITE_OTHER): Payer: Commercial Managed Care - HMO | Admitting: Internal Medicine

## 2016-01-02 VITALS — BP 130/80 | HR 87 | Temp 98.7°F | Resp 20 | Wt 253.0 lb

## 2016-01-02 DIAGNOSIS — Z0189 Encounter for other specified special examinations: Secondary | ICD-10-CM | POA: Diagnosis not present

## 2016-01-02 DIAGNOSIS — E785 Hyperlipidemia, unspecified: Secondary | ICD-10-CM | POA: Diagnosis not present

## 2016-01-02 DIAGNOSIS — E119 Type 2 diabetes mellitus without complications: Secondary | ICD-10-CM | POA: Diagnosis not present

## 2016-01-02 DIAGNOSIS — Z0001 Encounter for general adult medical examination with abnormal findings: Secondary | ICD-10-CM

## 2016-01-02 DIAGNOSIS — I10 Essential (primary) hypertension: Secondary | ICD-10-CM | POA: Diagnosis not present

## 2016-01-02 DIAGNOSIS — Z0000 Encounter for general adult medical examination without abnormal findings: Secondary | ICD-10-CM

## 2016-01-02 DIAGNOSIS — R6889 Other general symptoms and signs: Secondary | ICD-10-CM

## 2016-01-02 LAB — BASIC METABOLIC PANEL
BUN: 13 mg/dL (ref 6–23)
CALCIUM: 9.4 mg/dL (ref 8.4–10.5)
CHLORIDE: 102 meq/L (ref 96–112)
CO2: 28 meq/L (ref 19–32)
CREATININE: 0.85 mg/dL (ref 0.40–1.50)
GFR: 93.64 mL/min (ref >60.00)
GLUCOSE: 173 mg/dL — AB (ref 70–99)
Potassium: 4.1 mEq/L (ref 3.5–5.1)
Sodium: 139 mEq/L (ref 135–145)

## 2016-01-02 LAB — CBC WITH DIFFERENTIAL/PLATELET
BASOS ABS: 0 10*3/uL (ref 0.0–0.1)
Basophils Relative: 0.6 % (ref 0.0–3.0)
EOS ABS: 0.1 10*3/uL (ref 0.0–0.7)
Eosinophils Relative: 1.6 % (ref 0.0–5.0)
HEMATOCRIT: 41.3 % (ref 39.0–52.0)
HEMOGLOBIN: 14 g/dL (ref 13.0–17.0)
LYMPHS PCT: 21.8 % (ref 12.0–46.0)
Lymphs Abs: 1.7 10*3/uL (ref 0.7–4.0)
MCHC: 33.9 g/dL (ref 30.0–36.0)
MCV: 87.7 fl (ref 78.0–100.0)
MONOS PCT: 7.4 % (ref 3.0–12.0)
Monocytes Absolute: 0.6 10*3/uL (ref 0.1–1.0)
Neutro Abs: 5.4 10*3/uL (ref 1.4–7.7)
Neutrophils Relative %: 68.6 % (ref 43.0–77.0)
PLATELETS: 186 10*3/uL (ref 150.0–400.0)
RBC: 4.71 Mil/uL (ref 4.22–5.81)
RDW: 14.6 % (ref 11.5–15.5)
WBC: 7.8 10*3/uL (ref 4.0–10.5)

## 2016-01-02 LAB — URINALYSIS, ROUTINE W REFLEX MICROSCOPIC
BILIRUBIN URINE: NEGATIVE
Hgb urine dipstick: NEGATIVE
KETONES UR: NEGATIVE
LEUKOCYTES UA: NEGATIVE
NITRITE: NEGATIVE
SPECIFIC GRAVITY, URINE: 1.02 (ref 1.000–1.030)
Total Protein, Urine: NEGATIVE
URINE GLUCOSE: NEGATIVE
UROBILINOGEN UA: 0.2 (ref 0.0–1.0)
WBC, UA: NONE SEEN — AB (ref 0–?)
pH: 6 (ref 5.0–8.0)

## 2016-01-02 LAB — HEPATIC FUNCTION PANEL
ALBUMIN: 4.4 g/dL (ref 3.5–5.2)
ALT: 24 U/L (ref 0–53)
AST: 18 U/L (ref 0–37)
Alkaline Phosphatase: 44 U/L (ref 39–117)
BILIRUBIN DIRECT: 0.2 mg/dL (ref 0.0–0.3)
TOTAL PROTEIN: 6.8 g/dL (ref 6.0–8.3)
Total Bilirubin: 1.2 mg/dL (ref 0.2–1.2)

## 2016-01-02 LAB — TSH: TSH: 1.76 u[IU]/mL (ref 0.35–4.50)

## 2016-01-02 LAB — LIPID PANEL
CHOL/HDL RATIO: 3
Cholesterol: 119 mg/dL (ref 0–200)
HDL: 36.7 mg/dL — AB (ref >39.00)
LDL CALC: 50 mg/dL (ref 0–99)
NONHDL: 82.13
Triglycerides: 162 mg/dL — ABNORMAL HIGH (ref 0.0–149.0)
VLDL: 32.4 mg/dL (ref 0.0–40.0)

## 2016-01-02 LAB — MICROALBUMIN / CREATININE URINE RATIO
CREATININE, U: 151.4 mg/dL
MICROALB/CREAT RATIO: 4.3 mg/g (ref 0.0–30.0)
Microalb, Ur: 6.5 mg/dL — ABNORMAL HIGH (ref 0.0–1.9)

## 2016-01-02 LAB — HEMOGLOBIN A1C: Hgb A1c MFr Bld: 9.2 % — ABNORMAL HIGH (ref 4.6–6.5)

## 2016-01-02 LAB — PSA: PSA: 0.58 ng/mL (ref 0.10–4.00)

## 2016-01-02 NOTE — Progress Notes (Signed)
Pre visit review using our clinic review tool, if applicable. No additional management support is needed unless otherwise documented below in the visit note. 

## 2016-01-02 NOTE — Patient Instructions (Signed)
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
## 2016-01-04 ENCOUNTER — Encounter: Payer: Self-pay | Admitting: Internal Medicine

## 2016-01-04 ENCOUNTER — Other Ambulatory Visit: Payer: Self-pay | Admitting: Internal Medicine

## 2016-01-04 MED ORDER — PIOGLITAZONE HCL 45 MG PO TABS: 45.0000 mg | ORAL_TABLET | Freq: Every day | ORAL | Status: DC

## 2016-01-04 NOTE — Assessment & Plan Note (Signed)
stable overall by history and exam, recent data reviewed with pt, and pt to continue medical treatment as before,  to f/u any worsening symptoms or concerns Lab Results  Component Value Date   LDLCALC 50 01/02/2016

## 2016-01-04 NOTE — Assessment & Plan Note (Signed)

## 2016-01-04 NOTE — Assessment & Plan Note (Addendum)
Likely mild uncontrolled, may need further increased OHA, but will need f/u a1c ,o/w stable overall by history and exam, recent data reviewed with pt, and pt to continue medical treatment as before,  to f/u any worsening symptoms or concerns Last a1c 8.8  In addition to the time spent performing CPE, I spent an additional 15 minutes face to face,in which greater than 50% of this time was spent in counseling and coordination of care for patient's acute illness as documented.

## 2016-01-04 NOTE — Assessment & Plan Note (Signed)
stable overall by history and exam, recent data reviewed with pt, and pt to continue medical treatment as before,  to f/u any worsening symptoms or concerns BP Readings from Last 3 Encounters:  01/02/16 130/80  11/14/15 108/64  07/04/15 124/84

## 2016-01-22 ENCOUNTER — Ambulatory Visit (INDEPENDENT_AMBULATORY_CARE_PROVIDER_SITE_OTHER): Payer: Commercial Managed Care - HMO

## 2016-01-22 ENCOUNTER — Ambulatory Visit (INDEPENDENT_AMBULATORY_CARE_PROVIDER_SITE_OTHER): Payer: Commercial Managed Care - HMO | Admitting: Physician Assistant

## 2016-01-22 VITALS — BP 122/60 | HR 99 | Temp 98.4°F | Resp 16 | Ht 74.0 in | Wt 247.2 lb

## 2016-01-22 DIAGNOSIS — R059 Cough, unspecified: Secondary | ICD-10-CM

## 2016-01-22 DIAGNOSIS — J189 Pneumonia, unspecified organism: Secondary | ICD-10-CM | POA: Diagnosis not present

## 2016-01-22 DIAGNOSIS — R05 Cough: Secondary | ICD-10-CM | POA: Diagnosis not present

## 2016-01-22 DIAGNOSIS — R0602 Shortness of breath: Secondary | ICD-10-CM | POA: Diagnosis not present

## 2016-01-22 LAB — POCT CBC
Granulocyte percent: 78.2 %G (ref 37–80)
HCT, POC: 39.5 % — AB (ref 43.5–53.7)
Hemoglobin: 13.8 g/dL — AB (ref 14.1–18.1)
Lymph, poc: 0.9 (ref 0.6–3.4)
MCH, POC: 30.6 pg (ref 27–31.2)
MCHC: 35 g/dL (ref 31.8–35.4)
MCV: 87.4 fL (ref 80–97)
MID (CBC): 0.4 (ref 0–0.9)
MPV: 8.3 fL (ref 0–99.8)
PLATELET COUNT, POC: 98 10*3/uL — AB (ref 142–424)
POC Granulocyte: 4.9 (ref 2–6.9)
POC LYMPH %: 15 % (ref 10–50)
POC MID %: 6.8 %M (ref 0–12)
RBC: 4.53 M/uL — AB (ref 4.69–6.13)
RDW, POC: 13.7 %
WBC: 6.3 10*3/uL (ref 4.6–10.2)

## 2016-01-22 MED ORDER — LEVOFLOXACIN 500 MG PO TABS: 500.0000 mg | ORAL_TABLET | Freq: Every day | ORAL | Status: DC

## 2016-01-22 MED ORDER — ALBUTEROL SULFATE HFA 108 (90 BASE) MCG/ACT IN AERS: 2.0000 | INHALATION_SPRAY | RESPIRATORY_TRACT | Status: DC | PRN

## 2016-01-22 NOTE — Patient Instructions (Addendum)
levaquin once a day for 10 days Albuterol every 4 hours as needed for shortness of breath, wheezing or chest tightness Cut your glipizide in half for the next 10 days and then go back to your regular dose Return if your symptoms are not improving in the next 3 days or at any time if symptoms get much worse Return or follow up with your PCP for repeat chest xray in 4 weeks.    IF you received an x-ray today, you will receive an invoice from [facility]. Please contact [facility] at [phone] with questions or concerns regarding your invoice.   IF you received labwork today, you will receive an invoice from [facility]. Please contact [facility] at [phone] with questions or concerns regarding your invoice.   Our billing staff will not be able to assist you with questions regarding bills from these companies.  You will be contacted with the lab results as soon as they are available. The fastest way to get your results is to activate your My Chart account. Instructions are located on the last page of this paperwork. If you have not heard from us regarding the results in 2 weeks, please contact this office.
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
## 2016-01-25 ENCOUNTER — Ambulatory Visit (INDEPENDENT_AMBULATORY_CARE_PROVIDER_SITE_OTHER): Payer: Commercial Managed Care - HMO | Admitting: Physician Assistant

## 2016-01-25 VITALS — BP 122/80 | HR 78 | Temp 98.9°F | Resp 18 | Ht 74.0 in

## 2016-01-25 DIAGNOSIS — J189 Pneumonia, unspecified organism: Secondary | ICD-10-CM | POA: Diagnosis not present

## 2016-01-25 MED ORDER — PROMETHAZINE-DM 6.25-15 MG/5ML PO SYRP: 5.0000 mL | ORAL_SOLUTION | Freq: Four times a day (QID) | ORAL | Status: DC | PRN

## 2016-01-25 MED ORDER — PREDNISONE 5 MG PO TABS: ORAL_TABLET | ORAL | Status: DC

## 2016-01-25 MED ORDER — BENZONATATE 100 MG PO CAPS: 100.0000 mg | ORAL_CAPSULE | Freq: Three times a day (TID) | ORAL | Status: DC | PRN

## 2016-01-25 NOTE — Patient Instructions (Addendum)
Drink plenty of water (64 oz/day) and get plenty of rest. If you have been prescribed a cough syrup, do not drive or operate heavy machinery while using this medication  - take at night for sleep Take tessalon during the day for cough Take prednisone taper daily -- 8-7-6-5-4-3-2-1 Continue levaquin Return if symptoms not getting better in 1 week or sooner if symptoms worsen Return in 4 weeks for repeat chest xray   IF you received an x-ray today, you will receive an invoice from [facility]. Please contact [facility] at [phone] with questions or concerns regarding your invoice.   IF you received labwork today, you will receive an invoice from [facility]. Please contact [facility] at [phone] with questions or concerns regarding your invoice.   Our billing staff will not be able to assist you with questions regarding bills from these companies.  You will be contacted with the lab results as soon as they are available. The fastest way to get your results is to activate your My Chart account. Instructions are located on the last page of this paperwork. If you have not heard from us regarding the results in 2 weeks, please contact this office.
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
## 2016-02-06 ENCOUNTER — Other Ambulatory Visit: Payer: Self-pay | Admitting: Cardiology

## 2016-02-06 ENCOUNTER — Other Ambulatory Visit: Payer: Self-pay | Admitting: Internal Medicine

## 2016-02-06 NOTE — Telephone Encounter (Signed)
Rx(s) sent to pharmacy electronically.  

## 2016-02-19 ENCOUNTER — Ambulatory Visit (INDEPENDENT_AMBULATORY_CARE_PROVIDER_SITE_OTHER): Payer: Commercial Managed Care - HMO

## 2016-02-19 ENCOUNTER — Ambulatory Visit (INDEPENDENT_AMBULATORY_CARE_PROVIDER_SITE_OTHER): Payer: Commercial Managed Care - HMO | Admitting: Internal Medicine

## 2016-02-19 VITALS — BP 122/80 | HR 74 | Temp 98.0°F | Resp 16 | Ht 74.0 in | Wt 252.2 lb

## 2016-02-19 DIAGNOSIS — R938 Abnormal findings on diagnostic imaging of other specified body structures: Secondary | ICD-10-CM

## 2016-02-19 DIAGNOSIS — S30865A Insect bite (nonvenomous) of unspecified external genital organs, male, initial encounter: Secondary | ICD-10-CM | POA: Diagnosis not present

## 2016-02-19 DIAGNOSIS — R9389 Abnormal findings on diagnostic imaging of other specified body structures: Secondary | ICD-10-CM

## 2016-02-19 DIAGNOSIS — R918 Other nonspecific abnormal finding of lung field: Secondary | ICD-10-CM | POA: Diagnosis not present

## 2016-02-19 DIAGNOSIS — W57XXXA Bitten or stung by nonvenomous insect and other nonvenomous arthropods, initial encounter: Secondary | ICD-10-CM | POA: Diagnosis not present

## 2016-02-19 NOTE — Patient Instructions (Signed)
     IF you received an x-ray today, you will receive an invoice from [facility]. Please contact [facility] at [phone] with questions or concerns regarding your invoice.   IF you received labwork today, you will receive an invoice from [facility]. Please contact [facility] at [phone] with questions or concerns regarding your invoice.   Our billing staff will not be able to assist you with questions regarding bills from these companies.  You will be contacted with the lab results as soon as they are available. The fastest way to get your results is to activate your My Chart account. Instructions are located on the last page of this paperwork. If you have not heard from us regarding the results in 2 weeks, please contact this office.      

## 2016-02-19 NOTE — Progress Notes (Signed)
Subjective:  By signing my name below, I, Shane Moreno, attest that this documentation has been prepared under the direction and in the presence of Shane Sia, MD. Electronically Signed: Stann Moreno, Scribe. 02/19/2016 , 2:58 PM .  Patient was seen in Room 3 .   Patient ID: Shane Moreno, male    DOB: 1941-09-03, 75 y.o.   MRN: [id number] Chief Complaint  Patient presents with  . Follow-up    pneumonia and to have repeat xray   HPI Shane Moreno is a 75 y.o. male who presents to [facility] for follow up on pneumonia.  Patient states he's doing much better. He notes taking prednisone for great relief. He denies any wheezing/cough/fever/night sweats/weight loss.  Also notes tick bite L groin 2 d ago--removed and now w/ itching, discoloration  Patient Active Problem List   Diagnosis Date Noted  . CAP (community acquired pneumonia) 08/15/2014  . Encounter for screening colonoscopy 07/06/2014  . Chronic anticoagulation 07/06/2014  . Atrial flutter (HCC) 02/10/2014  . Colon cancer screening 01/25/2014  . Urinary urgency 12/16/2012  . Erectile dysfunction 12/16/2012  . Encounter for preventative adult health care exam with abnormal findings 10/12/2011  . Diabetes (HCC) 10/20/2009  . OBSTRUCTIVE SLEEP APNEA 10/20/2009  . Essential hypertension 10/20/2009  . Coronary atherosclerosis 10/12/2009  . ABDOMINAL BRUIT 10/12/2009  . Hyperlipidemia 10/11/2009  . ANEMIA, IRON DEFICIENCY 10/11/2009  . MITRAL VALVE PROLAPSE 10/11/2009  . Atrial fibrillation (HCC) 10/11/2009  . GERD 10/11/2009  . SYNCOPE 10/11/2009    Current outpatient prescriptions:  .  apixaban (ELIQUIS) 5 MG TABS tablet, Take 1 tablet (5 mg total) by mouth 2 (two) times daily., Disp: 180 tablet, Rfl: 3 .  atorvastatin (LIPITOR) 80 MG tablet, Take 1 tablet (80 mg total) by mouth daily., Disp: 90 tablet, Rfl: 3 .  Cholecalciferol (VITAMIN D) 1000 UNITS capsule, Take 1,000 Units by mouth daily., Disp: , Rfl:  .   Coenzyme Q10 (CO Q 10) 100 MG CAPS, Take 100 mg by mouth daily., Disp: , Rfl:  .  enalapril (VASOTEC) 5 MG tablet, TAKE 1 TABLET EVERY DAY, Disp: 90 tablet, Rfl: 3 .  glipiZIDE (GLUCOTROL XL) 10 MG 24 hr tablet, TAKE 1 TABLET (10 MG TOTAL) BY MOUTH DAILY WITH BREAKFAST., Disp: 90 tablet, Rfl: 3 .  Lancets (FREESTYLE) lancets, Use as directed once per day, Disp: 100 each, Rfl: 3 .  metFORMIN (GLUCOPHAGE) 1000 MG tablet, TAKE 1 TABLET TWICE DAILY, Disp: 180 tablet, Rfl: 3 .  metoprolol tartrate (LOPRESSOR) 25 MG tablet, Take 1 tablet (25 mg total) by mouth daily., Disp: 90 tablet, Rfl: 3 .  pioglitazone (ACTOS) 45 MG tablet, Take 1 tablet (45 mg total) by mouth daily., Disp: 90 tablet, Rfl: 3 .  TRUE METRIX BLOOD GLUCOSE TEST test strip, TEST BLOOD SUGAR EVERY DAY, Disp: 100 each, Rfl: 3 No Known Allergies  Review of Systems  Constitutional: Negative for fever, chills and fatigue.  HENT: Negative for congestion, rhinorrhea and sore throat.   Respiratory: Negative for cough, chest tightness, shortness of breath and wheezing.   Gastrointestinal: Negative for nausea and vomiting.  Skin: Negative for rash.      Objective:   Physical Exam  Constitutional: He is oriented to person, place, and time. He appears well-developed and well-nourished. No distress.  HENT:  Head: Normocephalic and atraumatic.  Eyes: EOM are normal. Pupils are equal, round, and reactive to light.  Neck: Neck supple.  Cardiovascular: Normal rate.   Pulmonary/Chest: Effort normal and  breath sounds normal. No respiratory distress. He has no wheezes.  Musculoskeletal: Normal range of motion.  Neurological: He is alert and oriented to person, place, and time.  Skin: Skin is warm and dry.     Center punctum w/ small nodule Surrounding ecchymoses and not erythema  Psychiatric: He has a normal mood and affect. His behavior is normal.  Nursing note and vitals reviewed.   BP 122/80 mmHg  Pulse 74  Temp(Src) 98 F (36.7  C) (Oral)  Resp 16  Ht 6\' 2"  (1.88 m)  Wt 252 lb 3.2 oz (114.397 kg)  BMI 32.37 kg/m2  SpO2 96%   Dg Chest 2 View  02/19/2016  CLINICAL DATA:  Interstitial prominence of lung bases on prior study. EXAM: CHEST  2 VIEW COMPARISON:  01/22/2016 FINDINGS: The lungs are clear wiithout focal pneumonia, edema, pneumothorax or pleural effusion. Interstitial markings are diffusely coarsened with chronic features. The cardiopericardial silhouette is within normal limits for size. The visualized bony structures of the thorax are intact. IMPRESSION: Interval resolution of the basilar abnormality seen on the previous study. No acute findings on today's exam. Electronically Signed   By: Shane Moreno  Mansell M.D.   On: 02/19/2016 15:22      Assessment & Plan:  Abnormal CXR - now resolved///reassured!  Tick bite--follow for signs/sxt tick associated illness  I have completed the patient encounter in its entirety as documented by the scribe, with editing by me where necessary. Shane Shidler P. Shane Moreno, M.D.
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
## 2016-03-04 ENCOUNTER — Other Ambulatory Visit: Payer: Self-pay | Admitting: Cardiology

## 2016-03-04 NOTE — Telephone Encounter (Signed)
Rx(s) sent to pharmacy electronically.  

## 2016-03-05 ENCOUNTER — Encounter: Payer: Self-pay | Admitting: Cardiology

## 2016-03-05 ENCOUNTER — Ambulatory Visit (INDEPENDENT_AMBULATORY_CARE_PROVIDER_SITE_OTHER): Payer: Commercial Managed Care - HMO | Admitting: Cardiology

## 2016-03-05 VITALS — BP 122/76 | HR 80 | Ht 74.0 in | Wt 253.0 lb

## 2016-03-05 DIAGNOSIS — I2510 Atherosclerotic heart disease of native coronary artery without angina pectoris: Secondary | ICD-10-CM | POA: Diagnosis not present

## 2016-03-05 DIAGNOSIS — E785 Hyperlipidemia, unspecified: Secondary | ICD-10-CM | POA: Diagnosis not present

## 2016-03-05 DIAGNOSIS — I4821 Permanent atrial fibrillation: Secondary | ICD-10-CM

## 2016-03-05 DIAGNOSIS — I482 Chronic atrial fibrillation: Secondary | ICD-10-CM | POA: Diagnosis not present

## 2016-03-05 DIAGNOSIS — I10 Essential (primary) hypertension: Secondary | ICD-10-CM | POA: Diagnosis not present

## 2016-03-05 NOTE — Assessment & Plan Note (Signed)
Blood pressure controlled. Continue present medications. 

## 2016-03-05 NOTE — Assessment & Plan Note (Addendum)
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 Continue apixaban. Continue metoprolol. He will need CBC and renal function checked every 6 months which is being followed by primary care.

## 2016-03-05 NOTE — Patient Instructions (Signed)
Your physician wants you to follow-up in: ONE YEAR WITH DR CRENSHAW You will receive a reminder letter in the mail two months in advance. If you don't receive a letter, please call our office to schedule the follow-up appointment.   If you need a refill on your cardiac medications before your next appointment, please call your pharmacy.  

## 2016-03-05 NOTE — Assessment & Plan Note (Addendum)
Continue statin. No aspirin given need for anticoagulation. He would prefer not to pursue a functional study.

## 2016-03-05 NOTE — Assessment & Plan Note (Addendum)
Continue Lipitor.Lipids and liver monitored by primary care. He has had some difficulties with elevated glucose that he feels may be related to higher dose statin. His primary care physician is treating his diabetes mellitus. If they continue to have problems we could reduce the Lipitor to 40 mg daily. He will contact us if his sugar remains difficult to control.

## 2016-04-03 ENCOUNTER — Other Ambulatory Visit: Payer: Self-pay | Admitting: Cardiology

## 2016-07-04 ENCOUNTER — Other Ambulatory Visit (INDEPENDENT_AMBULATORY_CARE_PROVIDER_SITE_OTHER): Payer: Commercial Managed Care - HMO

## 2016-07-04 ENCOUNTER — Other Ambulatory Visit: Payer: Self-pay | Admitting: Internal Medicine

## 2016-07-04 ENCOUNTER — Ambulatory Visit (INDEPENDENT_AMBULATORY_CARE_PROVIDER_SITE_OTHER): Payer: Commercial Managed Care - HMO | Admitting: Internal Medicine

## 2016-07-04 ENCOUNTER — Encounter: Payer: Self-pay | Admitting: Internal Medicine

## 2016-07-04 VITALS — BP 124/76 | HR 92 | Temp 97.8°F | Resp 20 | Wt 252.2 lb

## 2016-07-04 DIAGNOSIS — E119 Type 2 diabetes mellitus without complications: Secondary | ICD-10-CM | POA: Diagnosis not present

## 2016-07-04 DIAGNOSIS — I10 Essential (primary) hypertension: Secondary | ICD-10-CM

## 2016-07-04 DIAGNOSIS — E785 Hyperlipidemia, unspecified: Secondary | ICD-10-CM | POA: Diagnosis not present

## 2016-07-04 DIAGNOSIS — Z23 Encounter for immunization: Secondary | ICD-10-CM

## 2016-07-04 DIAGNOSIS — Z0001 Encounter for general adult medical examination with abnormal findings: Secondary | ICD-10-CM

## 2016-07-04 LAB — LIPID PANEL
CHOLESTEROL: 109 mg/dL (ref 0–200)
HDL: 43.5 mg/dL (ref >39.00)
LDL Cholesterol: 44 mg/dL (ref 0–99)
NonHDL: 65.31
TRIGLYCERIDES: 106 mg/dL (ref 0.0–149.0)
Total CHOL/HDL Ratio: 3
VLDL: 21.2 mg/dL (ref 0.0–40.0)

## 2016-07-04 LAB — HEPATIC FUNCTION PANEL
ALT: 17 U/L (ref 0–53)
AST: 17 U/L (ref 0–37)
Albumin: 4.4 g/dL (ref 3.5–5.2)
Alkaline Phosphatase: 44 U/L (ref 39–117)
Bilirubin, Direct: 0.2 mg/dL (ref 0.0–0.3)
TOTAL PROTEIN: 7.3 g/dL (ref 6.0–8.3)
Total Bilirubin: 1 mg/dL (ref 0.2–1.2)

## 2016-07-04 LAB — BASIC METABOLIC PANEL
BUN: 21 mg/dL (ref 6–23)
CHLORIDE: 102 meq/L (ref 96–112)
CO2: 28 meq/L (ref 19–32)
CREATININE: 0.9 mg/dL (ref 0.40–1.50)
Calcium: 9.7 mg/dL (ref 8.4–10.5)
GFR: 87.54 mL/min (ref >60.00)
GLUCOSE: 186 mg/dL — AB (ref 70–99)
Potassium: 4.5 mEq/L (ref 3.5–5.1)
Sodium: 139 mEq/L (ref 135–145)

## 2016-07-04 LAB — HEMOGLOBIN A1C: HEMOGLOBIN A1C: 8.3 % — AB (ref 4.6–6.5)

## 2016-07-04 MED ORDER — GLIPIZIDE ER 2.5 MG PO TB24: 2.5000 mg | ORAL_TABLET | Freq: Every day | ORAL | 3 refills | Status: DC

## 2016-07-04 MED ORDER — ROSUVASTATIN CALCIUM 40 MG PO TABS: 40.0000 mg | ORAL_TABLET | Freq: Every day | ORAL | 3 refills | Status: DC
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
## 2016-07-04 NOTE — Patient Instructions (Addendum)
You had the flu shot today  Ok to change the lipitor to generic crestor, if ok with your insurance  Please continue all other medications as before, and refills have been done if requested.  Please have the pharmacy call with any other refills you may need.  Please continue your efforts at being more active, low cholesterol diet, and weight control.  Please keep your appointments with your specialists as you may have planned  Please go to the LAB in the Basement (turn left off the elevator) for the tests to be done today  You will be contacted by phone if any changes need to be made immediately.  Otherwise, you will receive a letter about your results with an explanation, but please check with MyChart first.  Please remember to sign up for MyChart if you have not done so, as this will be important to you in the future with finding out test results, communicating by private email, and scheduling acute appointments online when needed.  Please return in 6 months, or sooner if needed, with Lab testing done 3-5 days before

## 2016-07-04 NOTE — Progress Notes (Signed)
Pre visit review using our clinic review tool, if applicable. No additional management support is needed unless otherwise documented below in the visit note. 

## 2016-07-07 NOTE — Assessment & Plan Note (Signed)
stable overall by history and exam, recent data reviewed with pt, and pt to continue medical treatment as before,  to f/u any worsening symptoms or concerns BP Readings from Last 3 Encounters:  07/04/16 124/76  03/05/16 122/76  02/19/16 122/80

## 2016-07-07 NOTE — Assessment & Plan Note (Signed)
stable overall by history and exam, recent data reviewed with pt, and pt to continue medical treatment as before,  to f/u any worsening symptoms or concerns Lab Results  Component Value Date   LDLCALC 44 07/04/2016

## 2016-07-07 NOTE — Assessment & Plan Note (Signed)
Mild uncontrolled by hx, will cont meds, diet, exercise for now and check a1c, with goal A1c < 7,  to f/u any worsening symptoms or concerns

## 2016-10-29 ENCOUNTER — Other Ambulatory Visit: Payer: Self-pay | Admitting: Internal Medicine

## 2016-10-31 ENCOUNTER — Ambulatory Visit (INDEPENDENT_AMBULATORY_CARE_PROVIDER_SITE_OTHER): Payer: Medicare HMO | Admitting: Physician Assistant

## 2016-10-31 VITALS — BP 120/70 | HR 65 | Temp 98.7°F | Resp 18 | Ht 74.0 in | Wt 260.5 lb

## 2016-10-31 DIAGNOSIS — Z87891 Personal history of nicotine dependence: Secondary | ICD-10-CM | POA: Diagnosis not present

## 2016-10-31 DIAGNOSIS — R05 Cough: Secondary | ICD-10-CM

## 2016-10-31 DIAGNOSIS — R059 Cough, unspecified: Secondary | ICD-10-CM

## 2016-10-31 MED ORDER — AZITHROMYCIN 250 MG PO TABS: ORAL_TABLET | ORAL | 0 refills | Status: AC

## 2016-10-31 NOTE — Patient Instructions (Signed)
     IF you received an x-ray today, you will receive an invoice from [facility]. Please contact [facility] at [phone] with questions or concerns regarding your invoice.   IF you received labwork today, you will receive an invoice from [facility]. Please contact [facility] at [phone] with questions or concerns regarding your invoice.   Our billing staff will not be able to assist you with questions regarding bills from these companies.  You will be contacted with the lab results as soon as they are available. The fastest way to get your results is to activate your My Chart account. Instructions are located on the last page of this paperwork. If you have not heard from us regarding the results in 2 weeks, please contact this office.     

## 2016-10-31 NOTE — Progress Notes (Signed)
10/31/2016 10:09 AM   DOB: 12/28/1940 / MRN: [id number]  SUBJECTIVE:  Shane Moreno is a 76 y.o. male presenting for chest congestion and cough x 2 days.  Associates head congestion and rhinorrhea.  Denies fever, myalgia, SOB, chest pain, leg swelling. He has tried Dayquil and this was helpful. He has had radiographic pneumonia about one year ago. He has a very heavy history of smoking and was able to stop 37 years ago, but at times he would smoke up to four packs daily.   Immunization History  Administered Date(s) Administered  . Influenza Whole 06/09/2009, 07/27/2010  . Influenza, High Dose Seasonal PF 06/17/2013, 06/22/2014  . Influenza,inj,Quad PF,36+ Mos 07/04/2015, 07/04/2016  . Pneumococcal Conjugate-13 07/01/2013  . Pneumococcal Polysaccharide-23 06/09/2009  . Td 09/10/2007  . Zoster 09/09/2008     He has No Known Allergies.   He  has a past medical history of ANEMIA, IRON DEFICIENCY (10/11/2009); Atrial fibrillation (HCC) (10/11/2009); CAD (10/12/2009); Cardiac arrhythmia due to congenital heart disease; DIABETES MELLITUS, TYPE II (10/20/2009); GERD (10/11/2009); HYPERLIPIDEMIA (10/11/2009); HYPERTENSION (10/20/2009); Kidney stones; MITRAL VALVE PROLAPSE (10/11/2009); and OBSTRUCTIVE SLEEP APNEA (10/20/2009).    He  reports that he quit smoking about 38 years ago. His smoking use included Cigarettes. He has a 80.00 pack-year smoking history. He has never used smokeless tobacco. He reports that he does not drink alcohol or use drugs. He  has no sexual activity history on file. The patient  has a past surgical history that includes Mandible surgery; Angioplasty; and Cardioversion (N/A, 02/21/2014).  His family history includes Breast cancer in his mother; Heart disease in his father; Hypertension in his father; Pancreatic cancer in his brother.  Review of Systems  Constitutional: Positive for malaise/fatigue. Negative for chills, diaphoresis and fever.  HENT: Positive for congestion and sore  throat.   Respiratory: Positive for cough. Negative for hemoptysis, shortness of breath and wheezing.   Cardiovascular: Negative for chest pain.  Gastrointestinal: Negative for nausea.  Skin: Negative for rash.  Neurological: Negative for dizziness and weakness.  Endo/Heme/Allergies: Negative for polydipsia.    The problem list and medications were reviewed and updated by myself where necessary and exist elsewhere in the encounter.   OBJECTIVE:  BP 120/70   Pulse 65   Temp 98.7 F (37.1 C) (Oral)   Resp 18   Ht 6\' 2"  (1.88 m)   Wt 260 lb 8 oz (118.2 kg)   SpO2 97%   BMI 33.45 kg/m   Physical Exam  Constitutional: He is oriented to person, place, and time.  HENT:  Right Ear: Tympanic membrane normal.  Left Ear: Tympanic membrane normal.  Nose: Nose normal.  Mouth/Throat: Uvula is midline, oropharynx is clear and moist and mucous membranes are normal.  Cardiovascular: Normal rate, regular rhythm and normal heart sounds.  Exam reveals no gallop, no friction rub and no decreased pulses.   No murmur heard. Pulmonary/Chest: Effort normal and breath sounds normal. No respiratory distress. He has no decreased breath sounds. He has no wheezes. He has no rhonchi. He has no rales.  Musculoskeletal: He exhibits no edema.  Neurological: He is alert and oriented to person, place, and time.  Skin: Skin is warm and dry. He is not diaphoretic.    No results found for this or any previous visit (from the past 72 hour(s)).  No results found.  ASSESSMENT AND PLAN:  Shane Moreno was seen today for sore throat and chest pain.  Diagnoses and all orders for this visit:  Cough Comments: Likely viral. However hx of pneumonia and very heavy smokng. Azithrymycin printed and he can start if fever, chills, SOB, chest pain and RTC if he fills.   Aggressive former smoker: See problem q.     The patient is advised to call or return to clinic if he does not see an improvement in symptoms, or to  seek the care of the closest emergency department if he worsens with the above plan.   Deliah Boston, MHS, PA-C [facility] 10/31/2016 10:09 AM

## 2016-11-30 ENCOUNTER — Other Ambulatory Visit: Payer: Self-pay | Admitting: Internal Medicine

## 2016-12-02 ENCOUNTER — Other Ambulatory Visit: Payer: Self-pay | Admitting: Cardiology

## 2016-12-05 IMAGING — CR DG CHEST 2V
2 series · 2 of 2 positions shown · non-contrast
Comparison: PA and lateral chest x-ray November 14, 2015

CLINICAL DATA: Cough and shortness of breath ; history of mitral
valve prolapse, atrial fibrillation, and coronary artery disease.
Former smoker.

EXAM:
CHEST  2 VIEW

[PA]
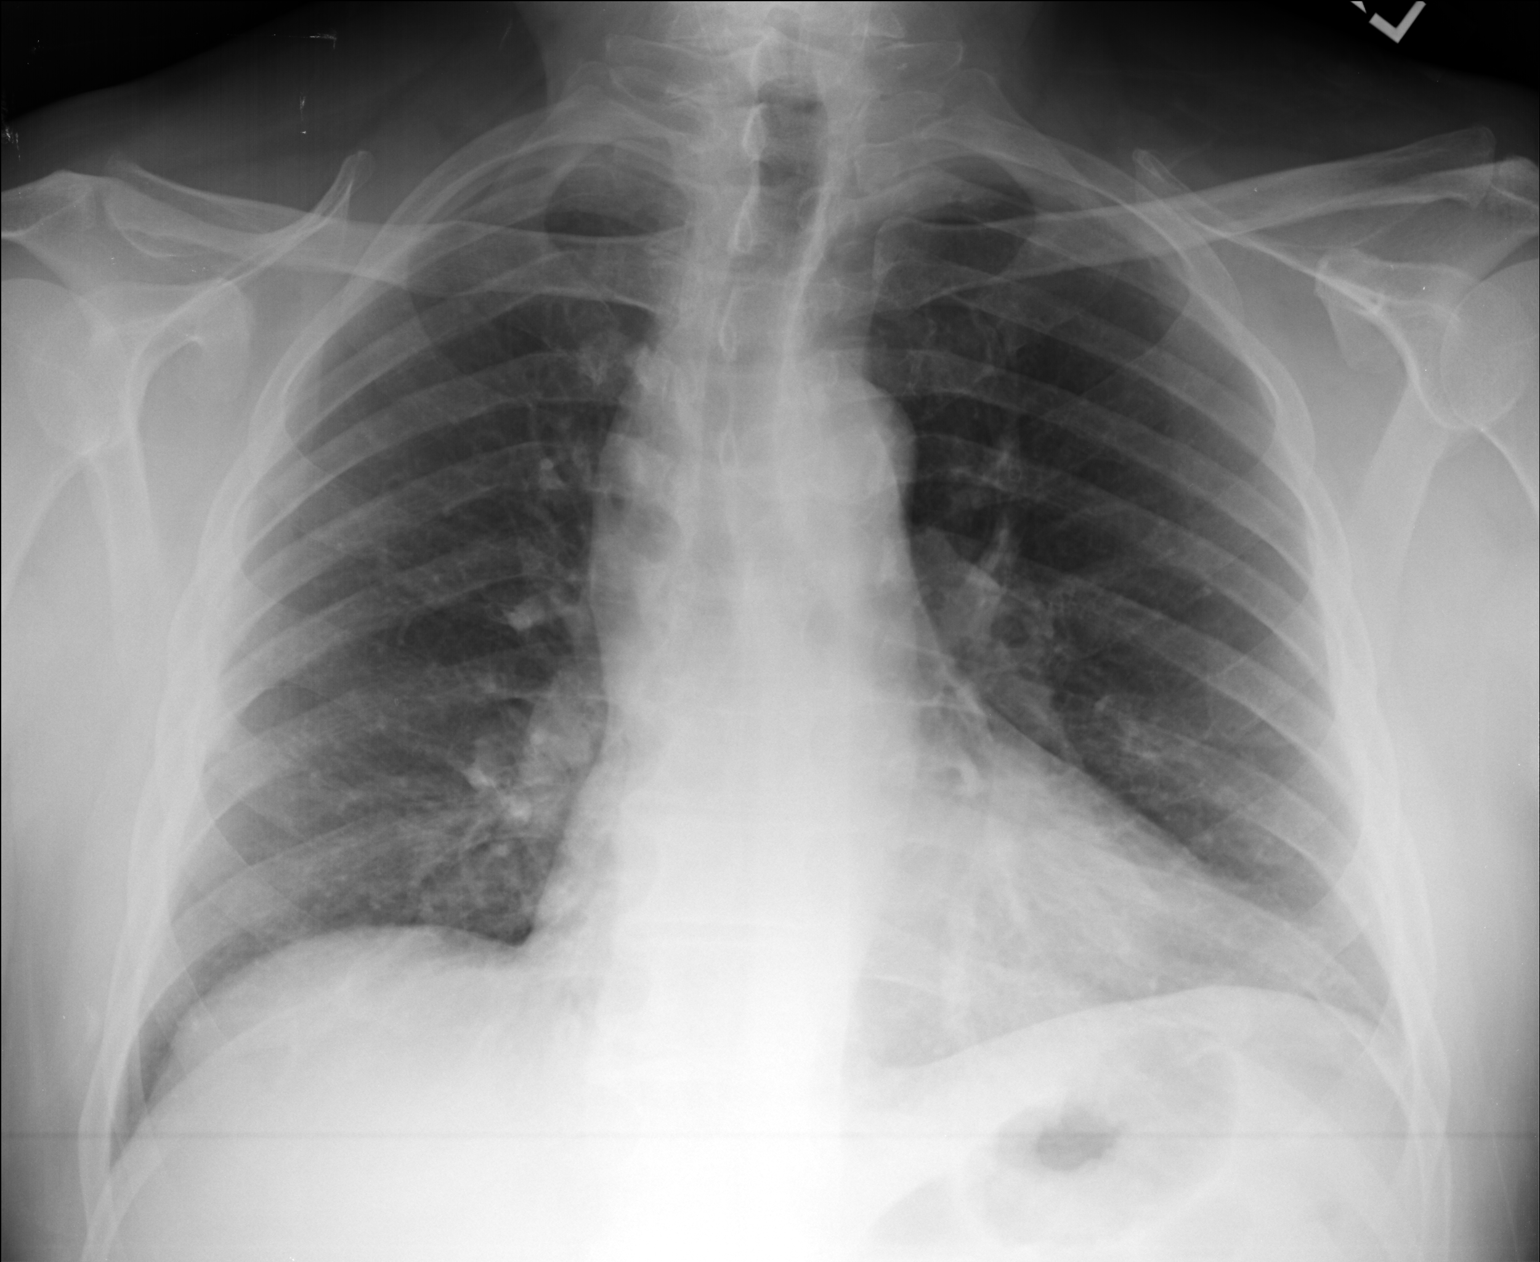

[lateral]
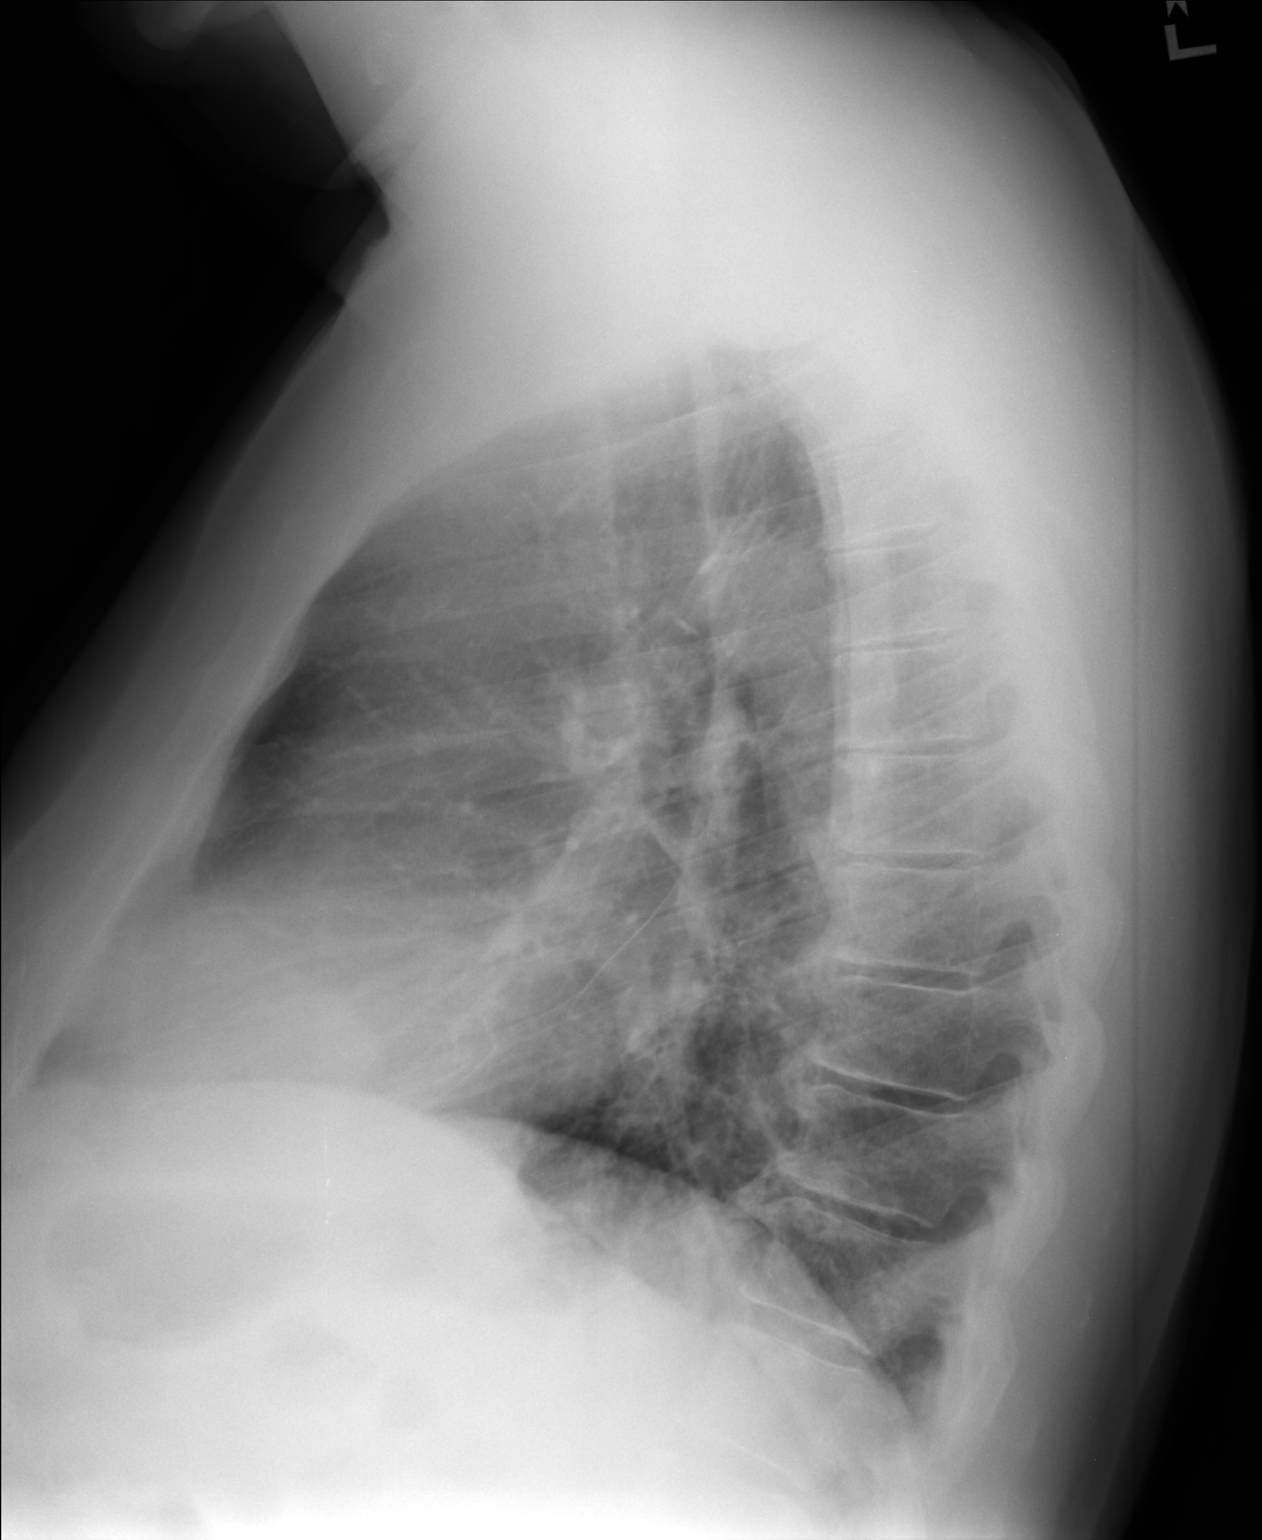

[2 of 2 positions shown; findings below may reference images not displayed]

FINDINGS: The lungs are adequately inflated. The interstitial markings are
mildly increased at both lung bases. There is no alveolar infiltrate
or pleural effusion. The heart is mildly enlarged but stable. The
pulmonary vascularity is normal. The trachea is midline. There is
degenerative endplate spurring in the lower thoracic spine.
IMPRESSION: Coarse lung markings at both bases consistent with subsegmental
atelectasis or early interstitial infiltrate likely in the lower
lobes. There is no alveolar pneumonia nor pulmonary edema.

Followup PA and lateral chest X-ray is recommended in 3-4 weeks
following trial of antibiotic therapy to ensure resolution and
exclude underlying malignancy.
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
## 2017-01-01 NOTE — Progress Notes (Signed)
Pre visit review using our clinic review tool, if applicable. No additional management support is needed unless otherwise documented below in the visit note. 

## 2017-01-02 ENCOUNTER — Ambulatory Visit (INDEPENDENT_AMBULATORY_CARE_PROVIDER_SITE_OTHER): Payer: Medicare HMO | Admitting: Internal Medicine

## 2017-01-02 ENCOUNTER — Other Ambulatory Visit: Payer: Self-pay | Admitting: Internal Medicine

## 2017-01-02 ENCOUNTER — Other Ambulatory Visit (INDEPENDENT_AMBULATORY_CARE_PROVIDER_SITE_OTHER): Payer: Medicare HMO

## 2017-01-02 ENCOUNTER — Encounter: Payer: Self-pay | Admitting: Internal Medicine

## 2017-01-02 ENCOUNTER — Other Ambulatory Visit: Payer: Medicare HMO

## 2017-01-02 VITALS — BP 126/78 | HR 72 | Resp 20 | Ht 74.0 in | Wt 257.0 lb

## 2017-01-02 DIAGNOSIS — Z0000 Encounter for general adult medical examination without abnormal findings: Secondary | ICD-10-CM | POA: Diagnosis not present

## 2017-01-02 DIAGNOSIS — E119 Type 2 diabetes mellitus without complications: Secondary | ICD-10-CM | POA: Diagnosis not present

## 2017-01-02 LAB — URINALYSIS, ROUTINE W REFLEX MICROSCOPIC
BILIRUBIN URINE: NEGATIVE
Hgb urine dipstick: NEGATIVE
KETONES UR: NEGATIVE
LEUKOCYTES UA: NEGATIVE
Nitrite: NEGATIVE
PH: 5.5 (ref 5.0–8.0)
Specific Gravity, Urine: >=1.03 — AB (ref 1.000–1.030)
Total Protein, Urine: NEGATIVE
URINE GLUCOSE: NEGATIVE
UROBILINOGEN UA: 0.2 (ref 0.0–1.0)
WBC, UA: NONE SEEN (ref 0–?)

## 2017-01-02 LAB — LIPID PANEL
CHOL/HDL RATIO: 2
CHOLESTEROL: 86 mg/dL (ref 0–200)
HDL: 34.8 mg/dL — ABNORMAL LOW (ref >39.00)
LDL Cholesterol: 35 mg/dL (ref 0–99)
NonHDL: 51.26
TRIGLYCERIDES: 83 mg/dL (ref 0.0–149.0)
VLDL: 16.6 mg/dL (ref 0.0–40.0)

## 2017-01-02 LAB — HEPATIC FUNCTION PANEL
ALK PHOS: 52 U/L (ref 39–117)
ALT: 21 U/L (ref 0–53)
AST: 19 U/L (ref 0–37)
Albumin: 4.5 g/dL (ref 3.5–5.2)
Bilirubin, Direct: 0.2 mg/dL (ref 0.0–0.3)
Total Bilirubin: 0.9 mg/dL (ref 0.2–1.2)
Total Protein: 6.9 g/dL (ref 6.0–8.3)

## 2017-01-02 LAB — BASIC METABOLIC PANEL
BUN: 26 mg/dL — AB (ref 6–23)
CHLORIDE: 105 meq/L (ref 96–112)
CO2: 28 mEq/L (ref 19–32)
Calcium: 9.7 mg/dL (ref 8.4–10.5)
Creatinine, Ser: 0.92 mg/dL (ref 0.40–1.50)
GFR: 85.23 mL/min (ref >60.00)
Glucose, Bld: 172 mg/dL — ABNORMAL HIGH (ref 70–99)
POTASSIUM: 4.4 meq/L (ref 3.5–5.1)
SODIUM: 139 meq/L (ref 135–145)

## 2017-01-02 LAB — CBC WITH DIFFERENTIAL/PLATELET
BASOS PCT: 0.8 % (ref 0.0–3.0)
Basophils Absolute: 0.1 10*3/uL (ref 0.0–0.1)
EOS PCT: 2.9 % (ref 0.0–5.0)
Eosinophils Absolute: 0.2 10*3/uL (ref 0.0–0.7)
HCT: 43 % (ref 39.0–52.0)
Hemoglobin: 14.7 g/dL (ref 13.0–17.0)
LYMPHS ABS: 1.7 10*3/uL (ref 0.7–4.0)
Lymphocytes Relative: 24.7 % (ref 12.0–46.0)
MCHC: 34.2 g/dL (ref 30.0–36.0)
MCV: 88.4 fl (ref 78.0–100.0)
MONO ABS: 0.8 10*3/uL (ref 0.1–1.0)
MONOS PCT: 11.9 % (ref 3.0–12.0)
NEUTROS ABS: 4.1 10*3/uL (ref 1.4–7.7)
NEUTROS PCT: 59.7 % (ref 43.0–77.0)
Platelets: 134 10*3/uL — ABNORMAL LOW (ref 150.0–400.0)
RBC: 4.87 Mil/uL (ref 4.22–5.81)
RDW: 13.7 % (ref 11.5–15.5)
WBC: 6.8 10*3/uL (ref 4.0–10.5)

## 2017-01-02 LAB — PSA: PSA: 0.47 ng/mL (ref 0.10–4.00)

## 2017-01-02 LAB — MICROALBUMIN / CREATININE URINE RATIO
CREATININE, U: 129.9 mg/dL
MICROALB UR: 6.9 mg/dL — AB (ref 0.0–1.9)
MICROALB/CREAT RATIO: 5.3 mg/g (ref 0.0–30.0)

## 2017-01-02 LAB — HEMOGLOBIN A1C: Hgb A1c MFr Bld: 9.1 % — ABNORMAL HIGH (ref 4.6–6.5)

## 2017-01-02 LAB — TSH: TSH: 1.44 u[IU]/mL (ref 0.35–4.50)

## 2017-01-02 IMAGING — DX DG CHEST 2V
2 series · 2 of 2 positions shown · non-contrast
Comparison: 01/22/2016

CLINICAL DATA: Interstitial prominence of lung bases on prior
study.

EXAM:
CHEST  2 VIEW

[chest pa]
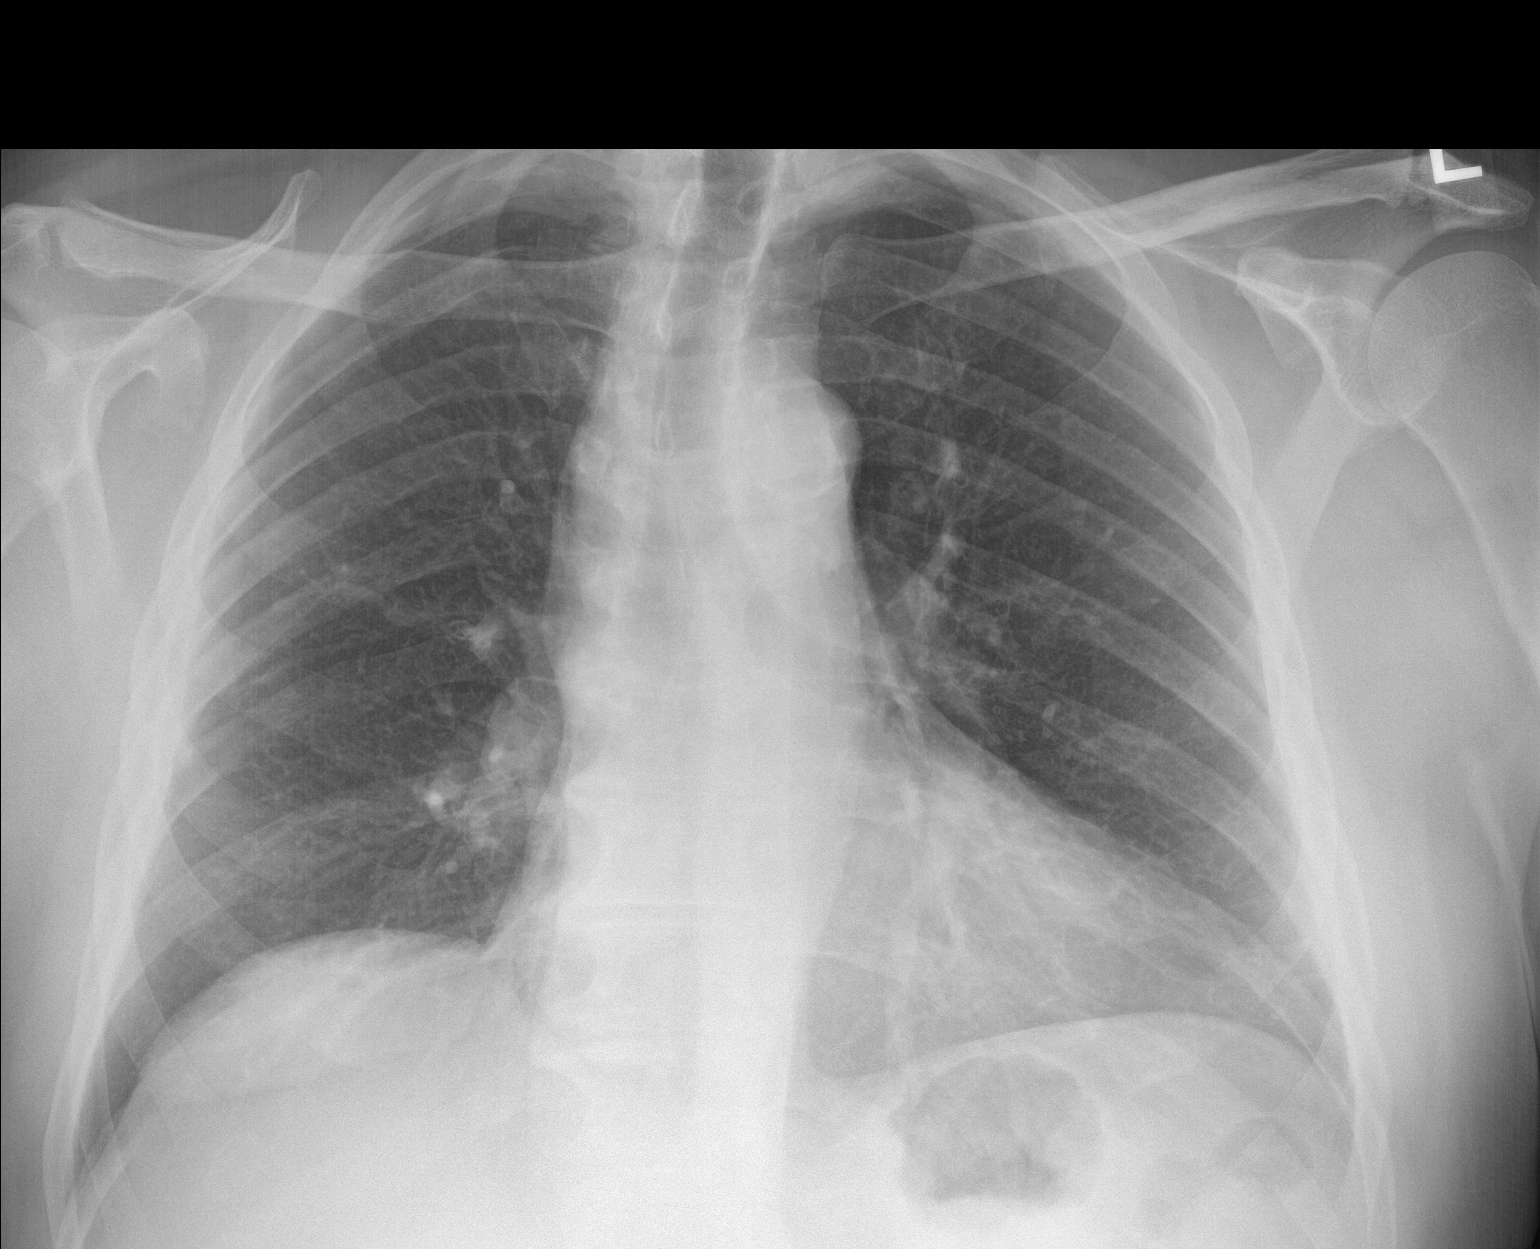

[chest lat]
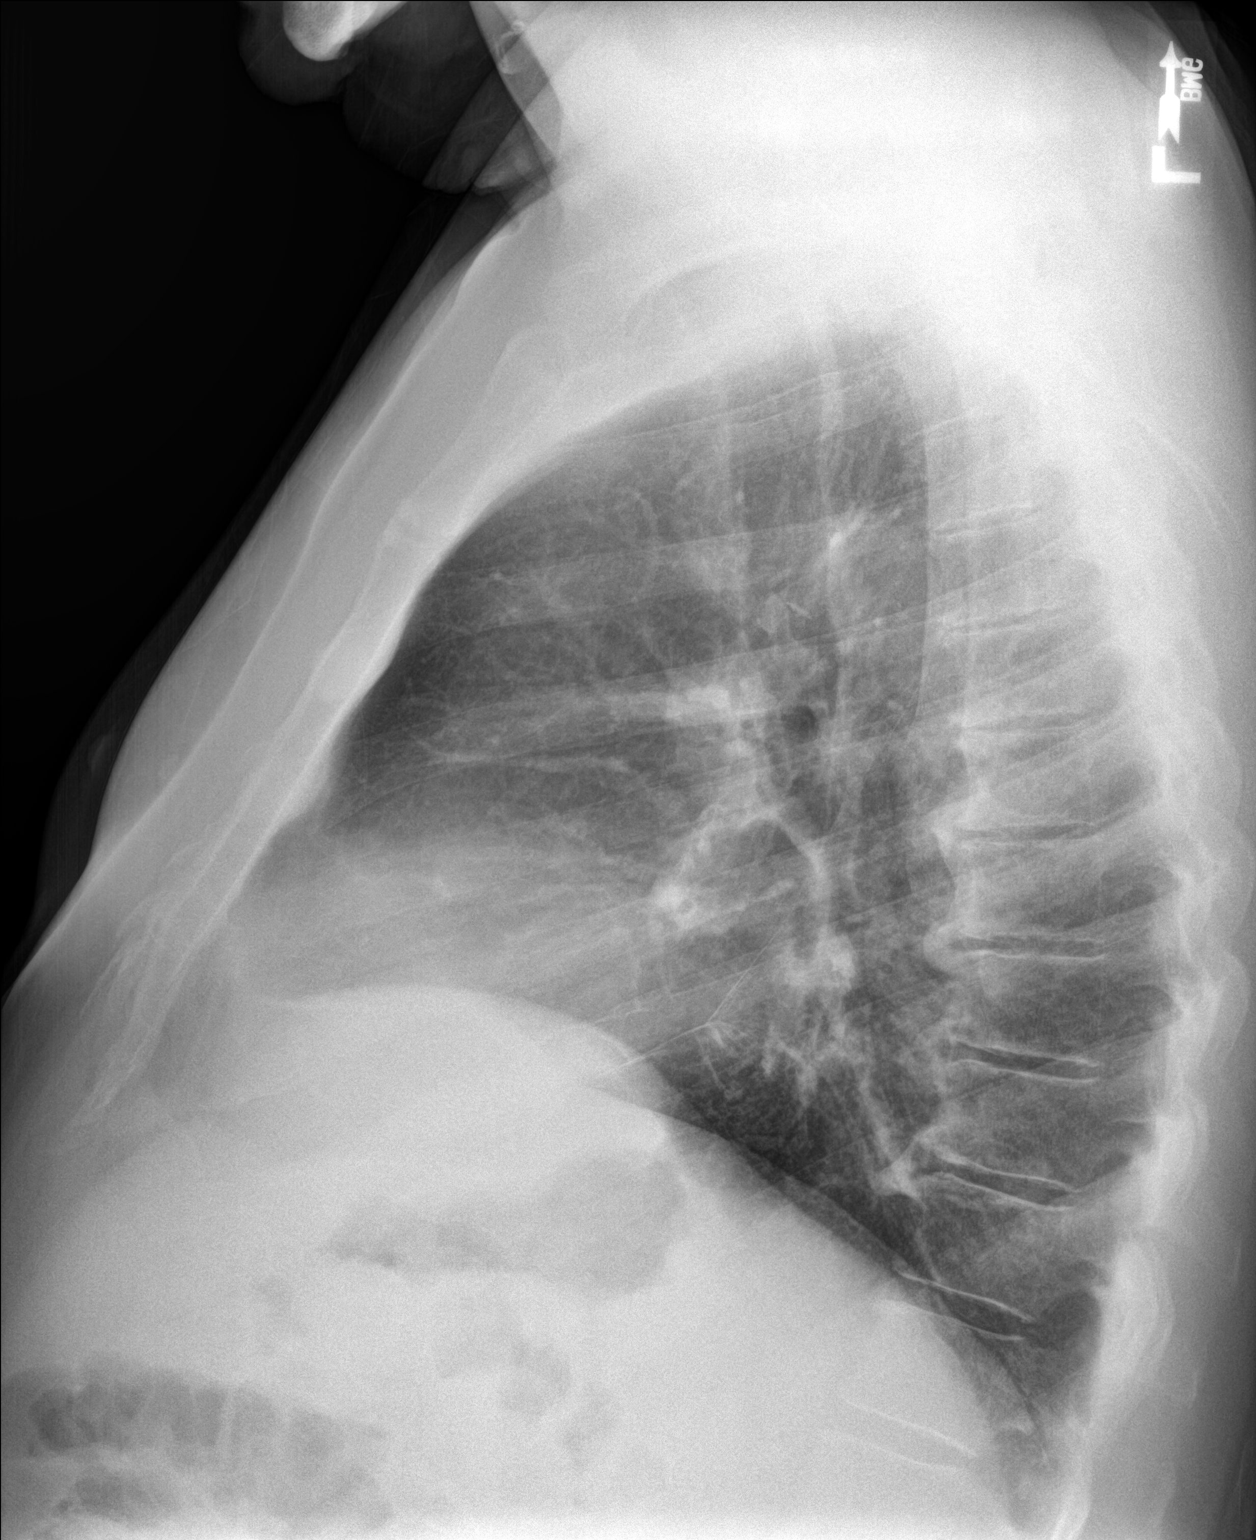

[2 of 2 positions shown; findings below may reference images not displayed]

FINDINGS: The lungs are clear wiithout focal pneumonia, edema, pneumothorax or
pleural effusion. Interstitial markings are diffusely coarsened with
chronic features. The cardiopericardial silhouette is within normal
limits for size. The visualized bony structures of the thorax are
intact.
IMPRESSION: Interval resolution of the basilar abnormality seen on the previous
study. No acute findings on today's exam.

## 2017-01-02 NOTE — Assessment & Plan Note (Signed)

## 2017-01-02 NOTE — Assessment & Plan Note (Signed)
stable overall by history and exam, recent data reviewed with pt, and pt to continue medical treatment as before,  to f/u any worsening symptoms or concerns Lab Results  Component Value Date   HGBA1C 8.3 (H) 07/04/2016   For f/u lab today
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
## 2017-01-02 NOTE — Patient Instructions (Addendum)
Please continue all other medications as before, and refills have been done if requested.  Please have the pharmacy call with any other refills you may need.  Please continue your efforts at being more active, low cholesterol diet, and weight control.  You are otherwise up to date with prevention measures today.  Please keep your appointments with your specialists as you may have planned  Please go to the LAB in the Basement (turn left off the elevator) for the tests to be done today  You will be contacted by phone if any changes need to be made immediately.  Otherwise, you will receive a letter about your results with an explanation, but please check with MyChart first.  Please remember to sign up for MyChart if you have not done so, as this will be important to you in the future with finding out test results, communicating by private email, and scheduling acute appointments online when needed.  Please return in 6 months, or sooner if needed, with Lab testing done 3-5 days before  Continue to eat heart healthy diet (full of fruits, vegetables, whole grains, lean protein, water--limit salt, fat, and sugar intake) and increase physical activity as tolerated.  Continue doing brain stimulating activities (puzzles, reading, adult coloring books, staying active) to keep memory sharp.    Mr. Shane Moreno , Thank you for taking time to come for your Medicare Wellness Visit. I appreciate your ongoing commitment to your health goals. Please review the following plan we discussed and let me know if I can assist you in the future.   These are the goals we discussed: Goals    . lose weight gradually           I plan to continue to exercise and eat healthy.        This is a list of the screening recommended for you and due dates:  Health Maintenance  Topic Date Due  . Eye exam for diabetics  06/16/2015  . Complete foot exam   01/01/2017  . Hemoglobin A1C  01/02/2017  . Flu Shot  04/09/2017  .  Tetanus Vaccine  09/09/2017  . Colon Cancer Screening  07/19/2024  . Pneumonia vaccines  Completed     Carbohydrate Counting for Diabetes Mellitus, Adult Carbohydrate counting is a method for keeping track of how many carbohydrates you eat. Eating carbohydrates naturally increases the amount of sugar (glucose) in the blood. Counting how many carbohydrates you eat helps keep your blood glucose within normal limits, which helps you manage your diabetes (diabetes mellitus). It is important to know how many carbohydrates you can safely have in each meal. This is different for every person. A diet and nutrition specialist (registered dietitian) can help you make a meal plan and calculate how many carbohydrates you should have at each meal and snack. Carbohydrates are found in the following foods:  Grains, such as breads and cereals.  Dried beans and soy products.  Starchy vegetables, such as potatoes, peas, and corn.  Fruit and fruit juices.  Milk and yogurt.  Sweets and snack foods, such as cake, cookies, candy, chips, and soft drinks. How do I count carbohydrates? There are two ways to count carbohydrates in food. You can use either of the methods or a combination of both. Reading "Nutrition Facts" on packaged food  The "Nutrition Facts" list is included on the labels of almost all packaged foods and beverages in the [place] It includes:  The serving size.  Information about nutrients in each  serving, including the grams (g) of carbohydrate per serving. To use the "Nutrition Facts":  Decide how many servings you will have.  Multiply the number of servings by the number of carbohydrates per serving.  The resulting number is the total amount of carbohydrates that you will be having. Learning standard serving sizes of other foods  When you eat foods containing carbohydrates that are not packaged or do not include "Nutrition Facts" on the label, you need to measure the servings in order  to count the amount of carbohydrates:  Measure the foods that you will eat with a food scale or measuring cup, if needed.  Decide how many standard-size servings you will eat.  Multiply the number of servings by 15. Most carbohydrate-rich foods have about 15 g of carbohydrates per serving.  For example, if you eat 8 oz (170 g) of strawberries, you will have eaten 2 servings and 30 g of carbohydrates (2 servings x 15 g = 30 g).  For foods that have more than one food mixed, such as soups and casseroles, you must count the carbohydrates in each food that is included. The following list contains standard serving sizes of common carbohydrate-rich foods. Each of these servings has about 15 g of carbohydrates:   hamburger bun or  English muffin.   oz (15 mL) syrup.   oz (14 g) jelly.  1 slice of bread.  1 six-inch tortilla.  3 oz (85 g) cooked rice or pasta.  4 oz (113 g) cooked dried beans.  4 oz (113 g) starchy vegetable, such as peas, corn, or potatoes.  4 oz (113 g) hot cereal.  4 oz (113 g) mashed potatoes or  of a large baked potato.  4 oz (113 g) canned or frozen fruit.  4 oz (120 mL) fruit juice.  4-6 crackers.  6 chicken nuggets.  6 oz (170 g) unsweetened dry cereal.  6 oz (170 g) plain fat-free yogurt or yogurt sweetened with artificial sweeteners.  8 oz (240 mL) milk.  8 oz (170 g) fresh fruit or one small piece of fruit.  24 oz (680 g) popped popcorn. Example of carbohydrate counting Sample meal   3 oz (85 g) chicken breast.  6 oz (170 g) brown rice.  4 oz (113 g) corn.  8 oz (240 mL) milk.  8 oz (170 g) strawberries with sugar-free whipped topping. Carbohydrate calculation  1. Identify the foods that contain carbohydrates:  Rice.  Corn.  Milk.  Strawberries. 2. Calculate how many servings you have of each food:  2 servings rice.  1 serving corn.  1 serving milk.  1 serving strawberries. 3. Multiply each number of servings by  15 g:  2 servings rice x 15 g = 30 g.  1 serving corn x 15 g = 15 g.  1 serving milk x 15 g = 15 g.  1 serving strawberries x 15 g = 15 g. 4. Add together all of the amounts to find the total grams of carbohydrates eaten:  30 g + 15 g + 15 g + 15 g = 75 g of carbohydrates total. This information is not intended to replace advice given to you by your health care provider. Make sure you discuss any questions you have with your health care provider. Document Released: 08/26/2005 Document Revised: 03/15/2016 Document Reviewed: 02/07/2016 Elsevier Interactive Patient Education  2017 Elsevier Inc.  High-Fiber Diet Fiber, also called dietary fiber, is a type of carbohydrate found in fruits, vegetables, whole  grains, and beans. A high-fiber diet can have many health benefits. Your health care provider may recommend a high-fiber diet to help: Prevent constipation. Fiber can make your bowel movements more regular. Lower your cholesterol. Relieve hemorrhoids, uncomplicated diverticulosis, or irritable bowel syndrome. Prevent overeating as part of a weight-loss plan. Prevent heart disease, type 2 diabetes, and certain cancers. What is my plan? The recommended daily intake of fiber includes: 38 grams for men under age 74. 30 grams for men over age 5. 25 grams for women under age 52. 21 grams for women over age 6. You can get the recommended daily intake of dietary fiber by eating a variety of fruits, vegetables, grains, and beans. Your health care provider may also recommend a fiber supplement if it is not possible to get enough fiber through your diet. What do I need to know about a high-fiber diet? Fiber supplements have not been widely studied for their effectiveness, so it is better to get fiber through food sources. Always check the fiber content on thenutrition facts label of any prepackaged food. Look for foods that contain at least 5 grams of fiber per serving. Ask your dietitian if  you have questions about specific foods that are related to your condition, especially if those foods are not listed in the following section. Increase your daily fiber consumption gradually. Increasing your intake of dietary fiber too quickly may cause bloating, cramping, or gas. Drink plenty of water. Water helps you to digest fiber. What foods can I eat? Grains  Whole-grain breads. Multigrain cereal. Oats and oatmeal. Brown rice. Barley. Bulgur wheat. Millet. Bran muffins. Popcorn. Rye wafer crackers. Vegetables  Sweet potatoes. Spinach. Kale. Artichokes. Cabbage. Broccoli. Green peas. Carrots. Squash. Fruits  Berries. Pears. Apples. Oranges. Avocados. Prunes and raisins. Dried figs. Meats and Other Protein Sources  Navy, kidney, pinto, and soy beans. Split peas. Lentils. Nuts and seeds. Dairy  Fiber-fortified yogurt. Beverages  Fiber-fortified soy milk. Fiber-fortified orange juice. Other  Fiber bars. The items listed above may not be a complete list of recommended foods or beverages. Contact your dietitian for more options.  What foods are not recommended? Grains  White bread. Pasta made with refined flour. White rice. Vegetables  Fried potatoes. Canned vegetables. Well-cooked vegetables. Fruits  Fruit juice. Cooked, strained fruit. Meats and Other Protein Sources  Fatty cuts of meat. Fried Environmental education officer or fried fish. Dairy  Milk. Yogurt. Cream cheese. Sour cream. Beverages  Soft drinks. Other  Cakes and pastries. Butter and oils. The items listed above may not be a complete list of foods and beverages to avoid. Contact your dietitian for more information.  What are some tips for including high-fiber foods in my diet? Eat a wide variety of high-fiber foods. Make sure that half of all grains consumed each day are whole grains. Replace breads and cereals made from refined flour or white flour with whole-grain breads and cereals. Replace white rice with brown rice, bulgur wheat,  or millet. Start the day with a breakfast that is high in fiber, such as a cereal that contains at least 5 grams of fiber per serving. Use beans in place of meat in soups, salads, or pasta. Eat high-fiber snacks, such as berries, raw vegetables, nuts, or popcorn. This information is not intended to replace advice given to you by your health care provider. Make sure you discuss any questions you have with your health care provider. Document Released: 08/26/2005 Document Revised: 02/01/2016 Document Reviewed: 02/08/2014 Elsevier Interactive Patient Education  2017  [employer].

## 2017-01-03 ENCOUNTER — Telehealth: Payer: Self-pay | Admitting: Internal Medicine

## 2017-01-03 ENCOUNTER — Other Ambulatory Visit: Payer: Self-pay | Admitting: Internal Medicine

## 2017-01-03 ENCOUNTER — Telehealth: Payer: Self-pay

## 2017-01-03 ENCOUNTER — Encounter: Payer: Self-pay | Admitting: Internal Medicine

## 2017-01-03 MED ORDER — GLIPIZIDE ER 10 MG PO TB24: 10.0000 mg | ORAL_TABLET | Freq: Every day | ORAL | 3 refills | Status: DC

## 2017-01-03 NOTE — Telephone Encounter (Signed)
Pt called for test results   Please call back

## 2017-01-03 NOTE — Telephone Encounter (Signed)
Pt was informed of results and expressed understanding.  

## 2017-01-03 NOTE — Telephone Encounter (Signed)
Pt was not home when I called but his wife stated that she would have him call the office when he gets in.

## 2017-01-03 NOTE — Telephone Encounter (Signed)
-----   Message from Corwin Levins, MD sent at 01/03/2017  1:06 PM EDT ----- Left message on MyChart, pt to cont same tx except   The test results show that your current treatment is OK, except the A1c is high, as we suspected might be.  We need to increase the Glipizide ER to 10 mg per day.  I will send a new prescription, and you should be called from the office as well.    Shirron to please inform pt, I will do rx

## 2017-01-17 ENCOUNTER — Telehealth: Payer: Self-pay | Admitting: Internal Medicine

## 2017-01-17 MED ORDER — GLIPIZIDE ER 10 MG PO TB24: 10.0000 mg | ORAL_TABLET | Freq: Every day | ORAL | 3 refills | Status: DC

## 2017-01-17 NOTE — Telephone Encounter (Signed)
Pt would like glipiZIDE (GLUCOTROL XL) 10 MG 24 hr tablet  [facility]

## 2017-02-17 ENCOUNTER — Other Ambulatory Visit: Payer: Self-pay | Admitting: Internal Medicine
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
## 2017-03-17 ENCOUNTER — Other Ambulatory Visit: Payer: Self-pay | Admitting: Internal Medicine

## 2017-03-19 ENCOUNTER — Ambulatory Visit (INDEPENDENT_AMBULATORY_CARE_PROVIDER_SITE_OTHER): Payer: Medicare HMO | Admitting: Cardiology

## 2017-03-19 ENCOUNTER — Encounter: Payer: Self-pay | Admitting: Cardiology

## 2017-03-19 VITALS — BP 130/72 | HR 61 | Ht 74.0 in | Wt 251.0 lb

## 2017-03-19 DIAGNOSIS — I2510 Atherosclerotic heart disease of native coronary artery without angina pectoris: Secondary | ICD-10-CM

## 2017-03-19 DIAGNOSIS — I4821 Permanent atrial fibrillation: Secondary | ICD-10-CM

## 2017-03-19 DIAGNOSIS — I10 Essential (primary) hypertension: Secondary | ICD-10-CM | POA: Diagnosis not present

## 2017-03-19 DIAGNOSIS — E7800 Pure hypercholesterolemia, unspecified: Secondary | ICD-10-CM | POA: Diagnosis not present

## 2017-03-19 DIAGNOSIS — I482 Chronic atrial fibrillation: Secondary | ICD-10-CM

## 2017-03-19 NOTE — Patient Instructions (Signed)
Your physician wants you to follow-up in: ONE YEAR WITH DR CRENSHAW You will receive a reminder letter in the mail two months in advance. If you don't receive a letter, please call our office to schedule the follow-up appointment.   If you need a refill on your cardiac medications before your next appointment, please call your pharmacy.  

## 2017-04-22 ENCOUNTER — Other Ambulatory Visit: Payer: Self-pay | Admitting: Cardiology

## 2017-06-18 ENCOUNTER — Other Ambulatory Visit: Payer: Self-pay | Admitting: Internal Medicine

## 2017-07-08 ENCOUNTER — Other Ambulatory Visit: Payer: Self-pay | Admitting: Internal Medicine

## 2017-07-08 ENCOUNTER — Encounter: Payer: Self-pay | Admitting: Internal Medicine

## 2017-07-08 ENCOUNTER — Other Ambulatory Visit (INDEPENDENT_AMBULATORY_CARE_PROVIDER_SITE_OTHER): Payer: Medicare HMO

## 2017-07-08 ENCOUNTER — Ambulatory Visit (INDEPENDENT_AMBULATORY_CARE_PROVIDER_SITE_OTHER): Payer: Medicare HMO | Admitting: Internal Medicine

## 2017-07-08 VITALS — BP 118/68 | HR 61 | Temp 98.5°F | Resp 12 | Wt 258.0 lb

## 2017-07-08 DIAGNOSIS — E7800 Pure hypercholesterolemia, unspecified: Secondary | ICD-10-CM | POA: Diagnosis not present

## 2017-07-08 DIAGNOSIS — I10 Essential (primary) hypertension: Secondary | ICD-10-CM

## 2017-07-08 DIAGNOSIS — E119 Type 2 diabetes mellitus without complications: Secondary | ICD-10-CM

## 2017-07-08 DIAGNOSIS — Z23 Encounter for immunization: Secondary | ICD-10-CM | POA: Diagnosis not present

## 2017-07-08 DIAGNOSIS — Z0000 Encounter for general adult medical examination without abnormal findings: Secondary | ICD-10-CM

## 2017-07-08 LAB — LIPID PANEL
Cholesterol: 95 mg/dL (ref 0–200)
HDL: 41.2 mg/dL (ref >39.00)
LDL Cholesterol: 37 mg/dL (ref 0–99)
NonHDL: 53.48
TRIGLYCERIDES: 84 mg/dL (ref 0.0–149.0)
Total CHOL/HDL Ratio: 2
VLDL: 16.8 mg/dL (ref 0.0–40.0)

## 2017-07-08 LAB — BASIC METABOLIC PANEL
BUN: 22 mg/dL (ref 6–23)
CHLORIDE: 103 meq/L (ref 96–112)
CO2: 28 meq/L (ref 19–32)
CREATININE: 0.93 mg/dL (ref 0.40–1.50)
Calcium: 9.7 mg/dL (ref 8.4–10.5)
GFR: 84.06 mL/min (ref >60.00)
Glucose, Bld: 187 mg/dL — ABNORMAL HIGH (ref 70–99)
Potassium: 4.5 mEq/L (ref 3.5–5.1)
Sodium: 140 mEq/L (ref 135–145)

## 2017-07-08 LAB — HEMOGLOBIN A1C: HEMOGLOBIN A1C: 8.5 % — AB (ref 4.6–6.5)

## 2017-07-08 LAB — HEPATIC FUNCTION PANEL
ALBUMIN: 4.3 g/dL (ref 3.5–5.2)
ALT: 12 U/L (ref 0–53)
AST: 15 U/L (ref 0–37)
Alkaline Phosphatase: 43 U/L (ref 39–117)
Bilirubin, Direct: 0.2 mg/dL (ref 0.0–0.3)
TOTAL PROTEIN: 7.1 g/dL (ref 6.0–8.3)
Total Bilirubin: 0.8 mg/dL (ref 0.2–1.2)

## 2017-07-08 MED ORDER — DULAGLUTIDE 0.75 MG/0.5ML ~~LOC~~ SOAJ: SUBCUTANEOUS | 3 refills | Status: DC

## 2017-07-08 NOTE — Patient Instructions (Addendum)
You had the flu shot today  Please continue all other medications as before, and refills have been done if requested.  Please have the pharmacy call with any other refills you may need.  Please continue your efforts at being more active, low cholesterol diet, and weight control.  You are otherwise up to date with prevention measures today.  Please keep your appointments with your specialists as you may have planned  Please go to the LAB in the Basement (turn left off the elevator) for the tests to be done today  You will be contacted by phone if any changes need to be made immediately.  Otherwise, you will receive a letter about your results with an explanation, but please check with MyChart first.  Please remember to sign up for MyChart if you have not done so, as this will be important to you in the future with finding out test results, communicating by private email, and scheduling acute appointments online when needed.  If you have Medicare related insurance (such as traditional Medicare, [employer] Medicare or [employer] Medicare, or similar), Please make an appointment at the [facility] desk with Noreene LarssonJill, the [facility], for your Wellness Visit in this office, which is a benefit with your insurance.  Please return in 6 months, or sooner if needed, with Lab testing done 3-5 days before
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
## 2017-07-10 DIAGNOSIS — H524 Presbyopia: Secondary | ICD-10-CM | POA: Diagnosis not present

## 2017-07-11 NOTE — Assessment & Plan Note (Signed)
stable overall by history and exam, recent data reviewed with pt, and pt to continue medical treatment as before,  to f/u any worsening symptoms or concerns  

## 2017-07-11 NOTE — Assessment & Plan Note (Signed)
stable overall by history and exam, recent data reviewed with pt, and pt to continue medical treatment as before,  to f/u any worsening symptoms or concerns BP Readings from Last 3 Encounters:  07/08/17 118/68  03/19/17 130/72  01/02/17 126/78

## 2017-07-11 NOTE — Assessment & Plan Note (Signed)
stable overall by history and exam, recent data reviewed with pt, and pt to continue medical treatment as before,  to f/u any worsening symptoms or concerns Lab Results  Component Value Date   LDLCALC 37 07/08/2017

## 2017-08-12 NOTE — Telephone Encounter (Signed)
error 

## 2017-10-20 ENCOUNTER — Other Ambulatory Visit: Payer: Self-pay | Admitting: Internal Medicine

## 2017-11-03 ENCOUNTER — Other Ambulatory Visit: Payer: Self-pay | Admitting: Internal Medicine

## 2017-12-08 ENCOUNTER — Other Ambulatory Visit: Payer: Self-pay | Admitting: Internal Medicine

## 2018-01-07 ENCOUNTER — Ambulatory Visit (INDEPENDENT_AMBULATORY_CARE_PROVIDER_SITE_OTHER): Payer: Medicare HMO | Admitting: Internal Medicine

## 2018-01-07 ENCOUNTER — Ambulatory Visit: Payer: Medicare HMO | Admitting: Internal Medicine

## 2018-01-07 ENCOUNTER — Other Ambulatory Visit: Payer: Self-pay | Admitting: Internal Medicine

## 2018-01-07 ENCOUNTER — Encounter: Payer: Self-pay | Admitting: Internal Medicine

## 2018-01-07 ENCOUNTER — Other Ambulatory Visit (INDEPENDENT_AMBULATORY_CARE_PROVIDER_SITE_OTHER): Payer: Medicare HMO

## 2018-01-07 ENCOUNTER — Telehealth: Payer: Self-pay

## 2018-01-07 VITALS — BP 112/68 | HR 70 | Temp 98.6°F | Ht 74.0 in | Wt 251.0 lb

## 2018-01-07 DIAGNOSIS — E7800 Pure hypercholesterolemia, unspecified: Secondary | ICD-10-CM

## 2018-01-07 DIAGNOSIS — E119 Type 2 diabetes mellitus without complications: Secondary | ICD-10-CM | POA: Diagnosis not present

## 2018-01-07 DIAGNOSIS — Z0000 Encounter for general adult medical examination without abnormal findings: Secondary | ICD-10-CM

## 2018-01-07 DIAGNOSIS — I10 Essential (primary) hypertension: Secondary | ICD-10-CM

## 2018-01-07 LAB — HEPATIC FUNCTION PANEL
ALT: 10 U/L (ref 0–53)
AST: 14 U/L (ref 0–37)
Albumin: 4.3 g/dL (ref 3.5–5.2)
Alkaline Phosphatase: 44 U/L (ref 39–117)
Bilirubin, Direct: 0.2 mg/dL (ref 0.0–0.3)
Total Bilirubin: 0.8 mg/dL (ref 0.2–1.2)
Total Protein: 6.9 g/dL (ref 6.0–8.3)

## 2018-01-07 LAB — LIPID PANEL
CHOLESTEROL: 90 mg/dL (ref 0–200)
HDL: 40.1 mg/dL (ref >39.00)
LDL Cholesterol: 35 mg/dL (ref 0–99)
NONHDL: 49.43
Total CHOL/HDL Ratio: 2
Triglycerides: 71 mg/dL (ref 0.0–149.0)
VLDL: 14.2 mg/dL (ref 0.0–40.0)

## 2018-01-07 LAB — CBC WITH DIFFERENTIAL/PLATELET
BASOS PCT: 1.2 % (ref 0.0–3.0)
Basophils Absolute: 0.1 10*3/uL (ref 0.0–0.1)
EOS ABS: 0.1 10*3/uL (ref 0.0–0.7)
EOS PCT: 2.6 % (ref 0.0–5.0)
HEMATOCRIT: 40.2 % (ref 39.0–52.0)
Hemoglobin: 13.9 g/dL (ref 13.0–17.0)
Lymphocytes Relative: 21.3 % (ref 12.0–46.0)
Lymphs Abs: 1.2 10*3/uL (ref 0.7–4.0)
MCHC: 34.6 g/dL (ref 30.0–36.0)
MCV: 88 fl (ref 78.0–100.0)
MONO ABS: 0.6 10*3/uL (ref 0.1–1.0)
Monocytes Relative: 11.3 % (ref 3.0–12.0)
NEUTROS ABS: 3.6 10*3/uL (ref 1.4–7.7)
Neutrophils Relative %: 63.6 % (ref 43.0–77.0)
PLATELETS: 126 10*3/uL — AB (ref 150.0–400.0)
RBC: 4.57 Mil/uL (ref 4.22–5.81)
RDW: 13.9 % (ref 11.5–15.5)
WBC: 5.7 10*3/uL (ref 4.0–10.5)

## 2018-01-07 LAB — MICROALBUMIN / CREATININE URINE RATIO
Creatinine,U: 66.9 mg/dL
MICROALB UR: 5.3 mg/dL — AB (ref 0.0–1.9)
MICROALB/CREAT RATIO: 8 mg/g (ref 0.0–30.0)

## 2018-01-07 LAB — URINALYSIS, ROUTINE W REFLEX MICROSCOPIC
BILIRUBIN URINE: NEGATIVE
Hgb urine dipstick: NEGATIVE
KETONES UR: NEGATIVE
LEUKOCYTES UA: NEGATIVE
NITRITE: NEGATIVE
PH: 6 (ref 5.0–8.0)
RBC / HPF: NONE SEEN (ref 0–?)
Specific Gravity, Urine: 1.01 (ref 1.000–1.030)
TOTAL PROTEIN, URINE-UPE24: NEGATIVE
UROBILINOGEN UA: 0.2 (ref 0.0–1.0)
Urine Glucose: NEGATIVE

## 2018-01-07 LAB — BASIC METABOLIC PANEL
BUN: 18 mg/dL (ref 6–23)
CO2: 26 mEq/L (ref 19–32)
Calcium: 9.4 mg/dL (ref 8.4–10.5)
Chloride: 102 mEq/L (ref 96–112)
Creatinine, Ser: 0.87 mg/dL (ref 0.40–1.50)
GFR: 90.66 mL/min (ref >60.00)
Glucose, Bld: 179 mg/dL — ABNORMAL HIGH (ref 70–99)
POTASSIUM: 4.7 meq/L (ref 3.5–5.1)
Sodium: 139 mEq/L (ref 135–145)

## 2018-01-07 LAB — HEMOGLOBIN A1C: Hgb A1c MFr Bld: 8.7 % — ABNORMAL HIGH (ref 4.6–6.5)

## 2018-01-07 LAB — TSH: TSH: 1.97 u[IU]/mL (ref 0.35–4.50)

## 2018-01-07 LAB — PSA: PSA: 0.47 ng/mL (ref 0.10–4.00)

## 2018-01-07 MED ORDER — EMPAGLIFLOZIN 25 MG PO TABS: 25.0000 mg | ORAL_TABLET | Freq: Every day | ORAL | 3 refills | Status: DC

## 2018-01-07 MED ORDER — DULAGLUTIDE 1.5 MG/0.5ML ~~LOC~~ SOAJ: SUBCUTANEOUS | 3 refills | Status: DC

## 2018-01-07 NOTE — Assessment & Plan Note (Signed)
Improved and stable overall by history and exam, recent data reviewed with pt, and pt to continue medical treatment as before,  to f/u any worsening symptoms or concerns  

## 2018-01-07 NOTE — Assessment & Plan Note (Signed)
stable overall by history and exam, recent data reviewed with pt, and pt to continue medical treatment as before,  to f/u any worsening symptoms or concerns  

## 2018-01-07 NOTE — Assessment & Plan Note (Signed)
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
## 2018-01-07 NOTE — Telephone Encounter (Signed)
Pt has been informed and expressed understanding.  

## 2018-01-07 NOTE — Patient Instructions (Addendum)
OK to stay off the trulicity; we could consider Bydureon or Jardiance  Please continue all other medications as before, and refills have been done if requested.  Please have the pharmacy call with any other refills you may need.  Please continue your efforts at being more active, low cholesterol diet, and weight control.  You are otherwise up to date with prevention measures today.  Please keep your appointments with your specialists as you may have planned  Please go to the LAB in the Basement (turn left off the elevator) for the tests to be done today  You will be contacted by phone if any changes need to be made immediately.  Otherwise, you will receive a letter about your results with an explanation, but please check with MyChart first.  Please remember to sign up for MyChart if you have not done so, as this will be important to you in the future with finding out test results, communicating by private email, and scheduling acute appointments online when needed.  Please return in 6 months, or sooner if needed, with Lab testing done 3-5 days before

## 2018-01-07 NOTE — Telephone Encounter (Signed)
-----   Message from Corwin Levins, MD sent at 01/07/2018  1:06 PM EDT ----- Left message on MyChart, pt to cont same tx except  The test results show that your current treatment is OK, except the A1c is still too high.  We should increase the trulicity to the higher dose.  In the future, if the A1c is still > 7, we should consider stopping some of the Diabetic medications and start insulin.    Shirron to please inform pt, I will do rx

## 2018-01-07 NOTE — Assessment & Plan Note (Signed)
Lab Results  Component Value Date   LDLCALC 37 07/08/2017  stable overall by history and exam, recent data reviewed with pt, and pt to continue medical treatment as before,  to f/u any worsening symptoms or concerns

## 2018-01-26 ENCOUNTER — Telehealth: Payer: Self-pay | Admitting: Internal Medicine

## 2018-01-26 MED ORDER — EMPAGLIFLOZIN 25 MG PO TABS
25.0000 mg | ORAL_TABLET | Freq: Every day | ORAL | 3 refills | Status: DC
Start: 2018-01-26 — End: 2018-10-01

## 2018-01-26 NOTE — Telephone Encounter (Signed)
Faxed

## 2018-01-26 NOTE — Telephone Encounter (Signed)
Signed and hardcopy rx attached

## 2018-01-26 NOTE — Telephone Encounter (Signed)
Placed on PCP desk for review

## 2018-01-26 NOTE — Telephone Encounter (Signed)
Patient dropped of Patient Assistance Program paperwork to be completed by Dr Jonny Ruiz for his [place] prescription. He has also included the necessary documents needed for the form. Placed in Dr Raphael Gibney box for completion.  Patient would like a call when it is done and would like it faxed to [employer].

## 2018-01-26 NOTE — Addendum Note (Signed)
Addended by: Corwin Levins on: 01/26/2018 01:45 PM   Modules accepted: Orders

## 2018-01-27 ENCOUNTER — Other Ambulatory Visit: Payer: Self-pay | Admitting: Internal Medicine

## 2018-01-30 ENCOUNTER — Telehealth: Payer: Self-pay | Admitting: *Deleted

## 2018-01-30 NOTE — Telephone Encounter (Signed)
Patient assistance paperwork faxed to [facility] for eliquis.

## 2018-02-05 NOTE — Telephone Encounter (Signed)
Patient approved for assistance.

## 2018-02-06 ENCOUNTER — Other Ambulatory Visit: Payer: Self-pay | Admitting: Internal Medicine

## 2018-02-09 ENCOUNTER — Other Ambulatory Visit: Payer: Self-pay | Admitting: Internal Medicine

## 2018-02-18 ENCOUNTER — Other Ambulatory Visit: Payer: Self-pay | Admitting: Cardiology
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
## 2018-04-14 ENCOUNTER — Telehealth: Payer: Self-pay | Admitting: Emergency Medicine

## 2018-04-14 ENCOUNTER — Encounter: Payer: Self-pay | Admitting: Cardiology

## 2018-04-14 ENCOUNTER — Ambulatory Visit: Payer: Medicare HMO | Admitting: Cardiology

## 2018-04-14 VITALS — BP 114/66 | HR 52 | Ht 74.5 in | Wt 243.8 lb

## 2018-04-14 DIAGNOSIS — I10 Essential (primary) hypertension: Secondary | ICD-10-CM | POA: Diagnosis not present

## 2018-04-14 DIAGNOSIS — I2510 Atherosclerotic heart disease of native coronary artery without angina pectoris: Secondary | ICD-10-CM

## 2018-04-14 DIAGNOSIS — I482 Chronic atrial fibrillation: Secondary | ICD-10-CM

## 2018-04-14 DIAGNOSIS — E7800 Pure hypercholesterolemia, unspecified: Secondary | ICD-10-CM | POA: Diagnosis not present

## 2018-04-14 DIAGNOSIS — I4821 Permanent atrial fibrillation: Secondary | ICD-10-CM

## 2018-04-14 NOTE — Patient Instructions (Signed)
Your physician wants you to follow-up in: ONE YEAR WITH DR CRENSHAW You will receive a reminder letter in the mail two months in advance. If you don't receive a letter, please call our office to schedule the follow-up appointment.   If you need a refill on your cardiac medications before your next appointment, please call your pharmacy.  

## 2018-04-14 NOTE — Telephone Encounter (Signed)
Called patient to schedule AWV. Pt will call back ata later date to schedule at anytime.

## 2018-07-16 ENCOUNTER — Encounter: Payer: Self-pay | Admitting: Internal Medicine

## 2018-07-16 ENCOUNTER — Ambulatory Visit (INDEPENDENT_AMBULATORY_CARE_PROVIDER_SITE_OTHER): Payer: Medicare HMO | Admitting: Internal Medicine

## 2018-07-16 ENCOUNTER — Other Ambulatory Visit (INDEPENDENT_AMBULATORY_CARE_PROVIDER_SITE_OTHER): Payer: Medicare HMO

## 2018-07-16 VITALS — BP 108/68 | HR 69 | Temp 98.4°F | Ht 74.5 in | Wt 244.0 lb

## 2018-07-16 DIAGNOSIS — E7800 Pure hypercholesterolemia, unspecified: Secondary | ICD-10-CM | POA: Diagnosis not present

## 2018-07-16 DIAGNOSIS — Z0000 Encounter for general adult medical examination without abnormal findings: Secondary | ICD-10-CM | POA: Diagnosis not present

## 2018-07-16 DIAGNOSIS — I10 Essential (primary) hypertension: Secondary | ICD-10-CM

## 2018-07-16 DIAGNOSIS — Z23 Encounter for immunization: Secondary | ICD-10-CM

## 2018-07-16 DIAGNOSIS — E119 Type 2 diabetes mellitus without complications: Secondary | ICD-10-CM

## 2018-07-16 LAB — BASIC METABOLIC PANEL
BUN: 21 mg/dL (ref 6–23)
CALCIUM: 9.7 mg/dL (ref 8.4–10.5)
CO2: 27 meq/L (ref 19–32)
CREATININE: 0.88 mg/dL (ref 0.40–1.50)
Chloride: 103 mEq/L (ref 96–112)
GFR: 89.35 mL/min (ref >60.00)
GLUCOSE: 149 mg/dL — AB (ref 70–99)
Potassium: 4.3 mEq/L (ref 3.5–5.1)
SODIUM: 139 meq/L (ref 135–145)

## 2018-07-16 LAB — HEMOGLOBIN A1C: Hgb A1c MFr Bld: 7.7 % — ABNORMAL HIGH (ref 4.6–6.5)

## 2018-07-16 LAB — HEPATIC FUNCTION PANEL
ALBUMIN: 4.5 g/dL (ref 3.5–5.2)
ALT: 9 U/L (ref 0–53)
AST: 14 U/L (ref 0–37)
Alkaline Phosphatase: 38 U/L — ABNORMAL LOW (ref 39–117)
BILIRUBIN TOTAL: 0.8 mg/dL (ref 0.2–1.2)
Bilirubin, Direct: 0.2 mg/dL (ref 0.0–0.3)
Total Protein: 7.4 g/dL (ref 6.0–8.3)

## 2018-07-16 LAB — LIPID PANEL
Cholesterol: 118 mg/dL (ref 0–200)
HDL: 42.4 mg/dL (ref >39.00)
LDL Cholesterol: 53 mg/dL (ref 0–99)
NonHDL: 75.56
Total CHOL/HDL Ratio: 3
Triglycerides: 113 mg/dL (ref 0.0–149.0)
VLDL: 22.6 mg/dL (ref 0.0–40.0)

## 2018-07-16 NOTE — Patient Instructions (Signed)
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
## 2018-07-18 NOTE — Assessment & Plan Note (Signed)
stable overall by history and exam, recent data reviewed with pt, and pt to continue medical treatment as before,  to f/u any worsening symptoms or concerns  

## 2018-08-19 ENCOUNTER — Telehealth: Payer: Self-pay | Admitting: *Deleted

## 2018-08-19 NOTE — Telephone Encounter (Signed)
Spoke with pt wife, reapplication for the assistance program for eliquis will be signed by dr Jens Somcrenshaw first fo the week and mailed to the patient to complete.

## 2018-08-23 DIAGNOSIS — I4891 Unspecified atrial fibrillation: Secondary | ICD-10-CM | POA: Diagnosis not present

## 2018-08-23 DIAGNOSIS — R55 Syncope and collapse: Secondary | ICD-10-CM | POA: Diagnosis not present

## 2018-08-23 DIAGNOSIS — R0902 Hypoxemia: Secondary | ICD-10-CM | POA: Diagnosis not present

## 2018-08-23 DIAGNOSIS — R61 Generalized hyperhidrosis: Secondary | ICD-10-CM | POA: Diagnosis not present

## 2018-10-01 ENCOUNTER — Telehealth: Payer: Self-pay | Admitting: Internal Medicine

## 2018-10-01 ENCOUNTER — Other Ambulatory Visit: Payer: Self-pay | Admitting: Internal Medicine

## 2018-10-01 MED ORDER — EMPAGLIFLOZIN 25 MG PO TABS: 25.0000 mg | ORAL_TABLET | Freq: Every day | ORAL | 3 refills | Status: DC

## 2018-10-01 NOTE — Telephone Encounter (Signed)
Has been faxed to the number provided on the forms. Sent to scan.

## 2018-10-01 NOTE — Telephone Encounter (Signed)
Pt dropped off patient assistance forms for JARDIANCE, placed in providers box, when completed please mail copy to patient.

## 2018-10-01 NOTE — Telephone Encounter (Signed)
Forms placed on PCP desk.

## 2018-10-02 ENCOUNTER — Other Ambulatory Visit: Payer: Self-pay | Admitting: Internal Medicine

## 2018-10-09 ENCOUNTER — Telehealth: Payer: Self-pay

## 2018-10-09 NOTE — Telephone Encounter (Signed)
Ok, but it might be helpful if he knew which one his insurance covers, since there are several different ones

## 2018-10-09 NOTE — Telephone Encounter (Signed)
Copied from CRM [id number]. Topic: General - Other >> Oct 09, 2018  1:31 PM Laural Benes, [place] C wrote: Reason for CRM: pt called in to follow up on a request from [facility]. Pt says that he was told that he can get a new blood sugar meter every year, pt says that its been a few years and he need a new meter. Pt says that [facility] stated that they have tried reaching office and requesting this several times. Pt would like to know if PCP could send a Rx for meter to [facility] for him?   Please assist.

## 2018-10-12 MED ORDER — TRUE METRIX METER W/DEVICE KIT: PACK | 0 refills | Status: DC

## 2018-10-12 MED ORDER — LANCETS MISC: 12 refills | Status: AC

## 2018-10-12 MED ORDER — GLUCOSE BLOOD VI STRP: ORAL_STRIP | 12 refills | Status: DC

## 2018-10-12 NOTE — Telephone Encounter (Signed)
Called pt, LVM.   

## 2018-10-12 NOTE — Telephone Encounter (Signed)
Ok, this is done 

## 2018-10-12 NOTE — Addendum Note (Signed)
Addended by: Corwin LevinsJOHN, Jennette Leask W on: 10/12/2018 07:07 PM   Modules accepted: Orders

## 2018-10-12 NOTE — Telephone Encounter (Signed)
Pt states insurance will cover the TRUE METRIX meter. Please send RX to mail order.  [facility] - [place], [place] - [address] [phone] (Phone) [phone] (Fax)

## 2018-10-27 ENCOUNTER — Other Ambulatory Visit: Payer: Self-pay | Admitting: Cardiology

## 2018-10-28 NOTE — Telephone Encounter (Signed)
Rx(s) sent to pharmacy electronically.  

## 2018-12-28 ENCOUNTER — Other Ambulatory Visit: Payer: Self-pay | Admitting: Internal Medicine

## 2019-01-19 ENCOUNTER — Ambulatory Visit: Payer: Medicare HMO | Admitting: Internal Medicine

## 2019-02-09 ENCOUNTER — Telehealth: Payer: Self-pay

## 2019-02-09 DIAGNOSIS — R0600 Dyspnea, unspecified: Secondary | ICD-10-CM

## 2019-02-09 NOTE — Telephone Encounter (Signed)
Ok this is done 

## 2019-02-09 NOTE — Addendum Note (Signed)
Addended by: Corwin Levins on: 02/09/2019 04:09 PM   Modules accepted: Orders

## 2019-02-09 NOTE — Telephone Encounter (Signed)
Pt requesting a referral be entered. He has an appt tomorrow with Dr. Sherene Sires.

## 2019-02-10 ENCOUNTER — Other Ambulatory Visit: Payer: Self-pay

## 2019-02-10 ENCOUNTER — Encounter: Payer: Self-pay | Admitting: Internal Medicine

## 2019-02-10 ENCOUNTER — Ambulatory Visit (INDEPENDENT_AMBULATORY_CARE_PROVIDER_SITE_OTHER): Payer: Medicare HMO | Admitting: Internal Medicine

## 2019-02-10 VITALS — BP 120/82 | HR 62 | Temp 98.1°F | Ht 75.0 in | Wt 249.2 lb

## 2019-02-10 DIAGNOSIS — G4733 Obstructive sleep apnea (adult) (pediatric): Secondary | ICD-10-CM

## 2019-02-10 DIAGNOSIS — I10 Essential (primary) hypertension: Secondary | ICD-10-CM | POA: Diagnosis not present

## 2019-02-10 NOTE — Progress Notes (Signed)
Shane Moreno, male    DOB: Feb 03, 1941,   MRN: [id number]   Brief patient profile:  81 yowm quit smoking 1980 at wt around 200 with peak wt around 280 and trending down on purpose since then complicated by osa/ hbp and tendency to severe cough to point of passing out but still tolerating  vasotec between these episodes  and no problems until 12/2018 full face mask from old [employer] CPAP broke and still using but leaks so referred to pulmonary clinic 02/10/2019 by Dr   Jenny Reichmann.     History of Present Illness  02/10/2019  Pulmonary/ 1st office eval/Keino Placencia  Chief Complaint  Patient presents with   Pulmonary Consult    Referred by Dr. Cathlean Cower for OSA. He states that he needs a new mask for his machine.   Dyspnea:  Most avid ex = full court basketball until covid closed it down Cough: minimal/ "throat congestion" but no excess mucus mostly daytime x years  Sleep: still trying to use up to 6 h with leak/ felt better/more rested when it was working  SABA use: none  No obvious day to day or daytime variability or assoc excess/ purulent sputum or mucus plugs or hemoptysis or cp or chest tightness, subjective wheeze or overt sinus or hb symptoms.   Sleeping as above s daytime hypersomnolence and  without nocturnal  or early am exacerbation  of respiratory  c/o's or need for noct saba. Also denies any obvious fluctuation of symptoms with weather or environmental changes or other aggravating or alleviating factors except as outlined above   No unusual exposure hx or h/o childhood pna/ asthma or knowledge of premature birth.  Current Allergies, Complete Past Medical History, Past Surgical History, Family History, and Social History were reviewed in [employer] record.  ROS  The following are not active complaints unless bolded Hoarseness, sore throat, dysphagia, dental problems, itching, sneezing,  nasal congestion or discharge of excess mucus or purulent secretions, ear ache,    fever, chills, sweats, unintended wt loss or wt gain, classically pleuritic or exertional cp,  orthopnea pnd or arm/hand swelling  or leg swelling, presyncope, palpitations, abdominal pain, anorexia, nausea, vomiting, diarrhea  or change in bowel habits or change in bladder habits, change in stools or change in urine, dysuria, hematuria,  rash, arthralgias, visual complaints, headache, numbness, weakness or ataxia or problems with walking or coordination,  change in mood or  memory.           Past Medical History:  Diagnosis Date   ANEMIA, IRON DEFICIENCY 10/11/2009   Atrial fibrillation ([place]) 10/11/2009   CAD 10/12/2009   Cardiac arrhythmia due to congenital heart disease    DIABETES MELLITUS, TYPE II 10/20/2009   GERD 10/11/2009   HYPERLIPIDEMIA 10/11/2009   HYPERTENSION 10/20/2009   Kidney stones    MITRAL VALVE PROLAPSE 10/11/2009   OBSTRUCTIVE SLEEP APNEA 10/20/2009    Outpatient Medications Prior to Visit  Medication Sig Dispense Refill   Blood Glucose Monitoring Suppl (TRUE METRIX METER) w/Device KIT Use as directed daily E11.9 1 kit 0   Cholecalciferol (VITAMIN D) 1000 UNITS capsule Take 2,000 Units by mouth daily.      Coenzyme Q10 (CO Q 10) 100 MG CAPS Take 100 mg by mouth daily.     ELIQUIS 5 MG TABS tablet TAKE 1 TABLET TWICE DAILY 180 tablet 3   empagliflozin (JARDIANCE) 25 MG TABS tablet Take 25 mg by mouth daily. 90 tablet 3  enalapril (VASOTEC) 5 MG tablet TAKE 1 TABLET EVERY DAY 90 tablet 3   glipiZIDE (GLUCOTROL XL) 10 MG 24 hr tablet TAKE 1 TABLET EVERY DAY WITH BREAKFAST 90 tablet 3   glucose blood test strip Use as instructed three times daily E11.9 300 each 12   Lancets MISC Use as directed three times daily E11.9 300 each 12   metFORMIN (GLUCOPHAGE) 1000 MG tablet Take 1,000 mg by mouth 2 (two) times daily with a meal. Take 1 tablet 2 times a day     metoprolol tartrate (LOPRESSOR) 25 MG tablet Take 1 tablet (25 mg total) by mouth daily. 90 tablet 1    pioglitazone (ACTOS) 45 MG tablet TAKE 1 TABLET EVERY DAY 90 tablet 1   rosuvastatin (CRESTOR) 40 MG tablet TAKE 1 TABLET EVERY DAY 90 tablet 3   TRUE METRIX BLOOD GLUCOSE TEST test strip TEST BLOOD SUGAR EVERY DAY 100 each 3   No facility-administered medications prior to visit.      Objective:     BP 120/82 (BP Location: Left Arm, Cuff Size: Normal)    Pulse 62    Temp 98.1 F (36.7 C) (Oral)    Ht 6' 3"  (1.905 m)    Wt 249 lb 3.2 oz (113 kg)    SpO2 96%    BMI 31.15 kg/m   SpO2: 96 %  RA     Amb obese wm nad   HEENT: nl dentition, turbinates bilaterally, and oropharynx. Nl external ear canals without cough reflex - Modified Mallampati Score =   1   NECK :  without JVD/Nodes/TM/ nl carotid upstrokes bilaterally   LUNGS: no acc muscle use,  Nl contour chest which is clear to A and P bilaterally without cough on insp or exp maneuvers   CV:  RRR  no s3 or murmur or increase in P2, and no edema   ABD:  soft and nontender with nl inspiratory excursion in the supine position. No bruits or organomegaly appreciated, bowel sounds nl  MS:  Nl gait/ ext warm without deformities, calf tenderness, cyanosis or clubbing No obvious joint restrictions   SKIN: warm and dry without lesions    NEURO:  alert, approp, nl sensorium with  no motor or cerebellar deficits apparent.    HCO3  07/17/2019 = 27     Lab Results  Component Value Date   HGB 13.9 01/07/2018   HGB 14.7 01/02/2017   HGB 13.8 (A) 01/22/2016   HGB 14.0 01/02/2016         Assessment   OBSTRUCTIVE SLEEP APNEA NPSG 2010:  AHI 5/hr with desat to 86% Titration study:   Optimal pressure 8cm.  - new mask / supplies ordered 02/10/2019 and referred to sleep medicine   >>> Despite leaky mask able to sleep flat and wakes up feeling fine s daytime hypersomnolence or HA but needs new equipment probably including a modern device with download capabilities and pehaps a new CPAP titration study if req by  insurance.    Essential hypertension ACE inhibitors are problematic in  pts with freq or  recurrent airway complaints (cough to passing out would qualify though has not happened recently)  because  even experienced pulmonologists can't always distinguish ace effects from copd/asthma.  By themselves they don't actually cause a problem, much like oxygen can't by itself start a fire, but they certainly serve as a powerful catalyst or enhancer for any "fire"  or inflammatory process in the upper airway, be it caused by  an ET  tube or more commonly reflux (especially in the obese or pts with known GERD or who are on biphoshonates).    In the era of ARB near equivalency, rec in event of crescendo of cough for any reason avoid ACEI  entirely in the short run and then decide later, having established a level of airway control using a reasonable limited regimen, whether to add back ace but even then being very careful to observe the pt for worsening airway control and number of meds used/ needed to control symptoms.        Total time devoted to counseling  > 50 % of initial 45 min office visit:  review case with pt/ discussion of options/alternatives/ personally creating written customized instructions  in presence of pt  then going over those specific  Instructions directly with the pt including how to use all of the meds but in particular covering each new medication in detail and the difference between the maintenance= "automatic" meds and the prns using an action plan format for the latter (If this problem/symptom => do that organization reading Left to right).  Please see AVS from this visit for a full list of these instructions which I personally wrote for this pt and  are unique to this visit.    Christinia Gully, MD 02/10/2019

## 2019-02-10 NOTE — Assessment & Plan Note (Signed)
NPSG 2010:  AHI 5/hr with desat to 86% Titration study:   Optimal pressure 8cm.  - new mask / supplies ordered 02/10/2019 and referred to sleep medicine   >>> Despite leaky mask able to sleep flat and wakes up feeling fine s daytime hypersomnolence or HA but needs new equipment probably including a modern device with download capabilities and pehaps a new CPAP titration study if req by insurance.

## 2019-02-10 NOTE — Patient Instructions (Addendum)
We will see if we can get you your equipment for the cpap machine and set you to see one of our sleep doctors next available    Follow up in pulmonary clinic is as needed if you substitute vasotec and it doesn't help after a few weeks

## 2019-02-10 NOTE — Assessment & Plan Note (Addendum)
ACE inhibitors are problematic in  pts with freq or  recurrent airway complaints (cough to passing out would qualify though has not happened recently)  because  even experienced pulmonologists can't always distinguish ace effects from copd/asthma.  By themselves they don't actually cause a problem, much like oxygen can't by itself start a fire, but they certainly serve as a powerful catalyst or enhancer for any "fire"  or inflammatory process in the upper airway, be it caused by an ET  tube or more commonly reflux (especially in the obese or pts with known GERD or who are on biphoshonates).    In the era of ARB near equivalency, rec in event of crescendo of cough for any reason avoid ACEI  entirely in the short run and then decide later, having established a level of airway control using a reasonable limited regimen, whether to add back ace but even then being very careful to observe the pt for worsening airway control and number of meds used/ needed to control symptoms.     Total time devoted to counseling  > 50 % of initial 45 min office visit:  review case with pt/ discussion of options/alternatives/ personally creating written customized instructions  in presence of pt  then going over those specific  Instructions directly with the pt including how to use all of the meds but in particular covering each new medication in detail and the difference between the maintenance= "automatic" meds and the prns using an action plan format for the latter (If this problem/symptom => do that organization reading Left to right).  Please see AVS from this visit for a full list of these instructions which I personally wrote for this pt and  are unique to this visit.

## 2019-02-12 ENCOUNTER — Telehealth: Payer: Self-pay | Admitting: Internal Medicine

## 2019-02-12 NOTE — Telephone Encounter (Signed)
Called & spoke w/ pt. Pt states he needs a new mask for his CPAP machine. LOV 02/10/2019 with an order for new CPAP supplies placed to [facility]. Pt states he called [facility] and [facility] stated they needed a prescription for a new mask. I let pt know I would call [facility] to see if they have received the previous order from our office.   Called & spoke w/ [facility] to verify they've received this order. I spoke w/ Fayrene Fearing, who stated he will get this taken care of.   Called & made pt aware of this. Pt verbalized understanding with no additional questions. Nothing further needed at this time.

## 2019-02-16 DIAGNOSIS — G4733 Obstructive sleep apnea (adult) (pediatric): Secondary | ICD-10-CM | POA: Diagnosis not present

## 2019-02-19 ENCOUNTER — Ambulatory Visit (INDEPENDENT_AMBULATORY_CARE_PROVIDER_SITE_OTHER): Payer: Medicare HMO | Admitting: Internal Medicine

## 2019-02-19 ENCOUNTER — Other Ambulatory Visit: Payer: Self-pay

## 2019-02-19 ENCOUNTER — Encounter: Payer: Self-pay | Admitting: Internal Medicine

## 2019-02-19 VITALS — BP 118/62 | HR 73 | Temp 98.2°F | Ht 75.0 in | Wt 250.2 lb

## 2019-02-19 DIAGNOSIS — G4733 Obstructive sleep apnea (adult) (pediatric): Secondary | ICD-10-CM

## 2019-02-19 DIAGNOSIS — I4819 Other persistent atrial fibrillation: Secondary | ICD-10-CM | POA: Diagnosis not present

## 2019-02-19 NOTE — Patient Instructions (Signed)
Order- DME [facility] please replace old CPAP machine, change to auto 5-15, mask of choice, humidifier, supplies, [employer]/ card  Please call if we can help 
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
## 2019-02-20 NOTE — Assessment & Plan Note (Signed)
Download unavailable but he describes good compliance and control with use every night and definitely improved sleep. Plan-replace old machine, changing to AutoPap 5-15, mask of choice, hoses/filters/supplies, humidifier, Airview/card

## 2019-02-20 NOTE — Assessment & Plan Note (Signed)
Heart rhythm is very regular at this visit and may be sinus or well controlled A. fib, managed by cardiology.

## 2019-03-02 ENCOUNTER — Other Ambulatory Visit: Payer: Self-pay

## 2019-03-02 ENCOUNTER — Other Ambulatory Visit: Payer: Self-pay | Admitting: Internal Medicine

## 2019-03-02 ENCOUNTER — Ambulatory Visit (INDEPENDENT_AMBULATORY_CARE_PROVIDER_SITE_OTHER): Payer: Medicare HMO | Admitting: Internal Medicine

## 2019-03-02 ENCOUNTER — Encounter: Payer: Self-pay | Admitting: Internal Medicine

## 2019-03-02 ENCOUNTER — Other Ambulatory Visit (INDEPENDENT_AMBULATORY_CARE_PROVIDER_SITE_OTHER): Payer: Medicare HMO

## 2019-03-02 VITALS — BP 126/82 | HR 66 | Temp 98.3°F | Ht 75.0 in | Wt 248.0 lb

## 2019-03-02 DIAGNOSIS — R351 Nocturia: Secondary | ICD-10-CM | POA: Diagnosis not present

## 2019-03-02 DIAGNOSIS — E559 Vitamin D deficiency, unspecified: Secondary | ICD-10-CM | POA: Diagnosis not present

## 2019-03-02 DIAGNOSIS — D509 Iron deficiency anemia, unspecified: Secondary | ICD-10-CM

## 2019-03-02 DIAGNOSIS — Z23 Encounter for immunization: Secondary | ICD-10-CM

## 2019-03-02 DIAGNOSIS — Z0001 Encounter for general adult medical examination with abnormal findings: Secondary | ICD-10-CM | POA: Diagnosis not present

## 2019-03-02 DIAGNOSIS — E538 Deficiency of other specified B group vitamins: Secondary | ICD-10-CM

## 2019-03-02 DIAGNOSIS — I10 Essential (primary) hypertension: Secondary | ICD-10-CM

## 2019-03-02 DIAGNOSIS — E119 Type 2 diabetes mellitus without complications: Secondary | ICD-10-CM

## 2019-03-02 LAB — URINALYSIS, ROUTINE W REFLEX MICROSCOPIC
Bilirubin Urine: NEGATIVE
Hgb urine dipstick: NEGATIVE
Ketones, ur: NEGATIVE
Leukocytes,Ua: NEGATIVE
Nitrite: NEGATIVE
RBC / HPF: NONE SEEN (ref 0–?)
Specific Gravity, Urine: 1.015 (ref 1.000–1.030)
Total Protein, Urine: NEGATIVE
Urine Glucose: >=1000 — AB
Urobilinogen, UA: 0.2 (ref 0.0–1.0)
WBC, UA: NONE SEEN (ref 0–?)
pH: 5.5 (ref 5.0–8.0)

## 2019-03-02 LAB — BASIC METABOLIC PANEL
BUN: 24 mg/dL — ABNORMAL HIGH (ref 6–23)
CO2: 25 mEq/L (ref 19–32)
Calcium: 9.1 mg/dL (ref 8.4–10.5)
Chloride: 105 mEq/L (ref 96–112)
Creatinine, Ser: 0.92 mg/dL (ref 0.40–1.50)
GFR: 79.73 mL/min (ref >60.00)
Glucose, Bld: 149 mg/dL — ABNORMAL HIGH (ref 70–99)
Potassium: 4.3 mEq/L (ref 3.5–5.1)
Sodium: 140 mEq/L (ref 135–145)

## 2019-03-02 LAB — HEPATIC FUNCTION PANEL
ALT: 11 U/L (ref 0–53)
AST: 16 U/L (ref 0–37)
Albumin: 4.5 g/dL (ref 3.5–5.2)
Alkaline Phosphatase: 42 U/L (ref 39–117)
Bilirubin, Direct: 0.2 mg/dL (ref 0.0–0.3)
Total Bilirubin: 0.9 mg/dL (ref 0.2–1.2)
Total Protein: 6.9 g/dL (ref 6.0–8.3)

## 2019-03-02 LAB — CBC WITH DIFFERENTIAL/PLATELET
Basophils Absolute: 0 10*3/uL (ref 0.0–0.1)
Basophils Relative: 0.9 % (ref 0.0–3.0)
Eosinophils Absolute: 0.1 10*3/uL (ref 0.0–0.7)
Eosinophils Relative: 2.6 % (ref 0.0–5.0)
HCT: 44.1 % (ref 39.0–52.0)
Hemoglobin: 14.6 g/dL (ref 13.0–17.0)
Lymphocytes Relative: 24.4 % (ref 12.0–46.0)
Lymphs Abs: 1.2 10*3/uL (ref 0.7–4.0)
MCHC: 33.2 g/dL (ref 30.0–36.0)
MCV: 91 fl (ref 78.0–100.0)
Monocytes Absolute: 0.6 10*3/uL (ref 0.1–1.0)
Monocytes Relative: 12 % (ref 3.0–12.0)
Neutro Abs: 3 10*3/uL (ref 1.4–7.7)
Neutrophils Relative %: 60.1 % (ref 43.0–77.0)
Platelets: 117 10*3/uL — ABNORMAL LOW (ref 150.0–400.0)
RBC: 4.84 Mil/uL (ref 4.22–5.81)
RDW: 14.4 % (ref 11.5–15.5)
WBC: 5 10*3/uL (ref 4.0–10.5)

## 2019-03-02 LAB — IBC PANEL
Iron: 96 ug/dL (ref 42–165)
Saturation Ratios: 21.6 % (ref 20.0–50.0)
Transferrin: 317 mg/dL (ref 212.0–360.0)

## 2019-03-02 LAB — MICROALBUMIN / CREATININE URINE RATIO
Creatinine,U: 76.4 mg/dL
Microalb Creat Ratio: 1.9 mg/g (ref 0.0–30.0)
Microalb, Ur: 1.5 mg/dL (ref 0.0–1.9)

## 2019-03-02 LAB — VITAMIN D 25 HYDROXY (VIT D DEFICIENCY, FRACTURES): VITD: 44.63 ng/mL (ref 30.00–100.00)

## 2019-03-02 LAB — LIPID PANEL
Cholesterol: 110 mg/dL (ref 0–200)
HDL: 40 mg/dL (ref >39.00)
LDL Cholesterol: 48 mg/dL (ref 0–99)
NonHDL: 69.85
Total CHOL/HDL Ratio: 3
Triglycerides: 109 mg/dL (ref 0.0–149.0)
VLDL: 21.8 mg/dL (ref 0.0–40.0)

## 2019-03-02 LAB — TSH: TSH: 1.41 u[IU]/mL (ref 0.35–4.50)

## 2019-03-02 LAB — HEMOGLOBIN A1C: Hgb A1c MFr Bld: 7.8 % — ABNORMAL HIGH (ref 4.6–6.5)

## 2019-03-02 LAB — PSA: PSA: 0.45 ng/mL (ref 0.10–4.00)

## 2019-03-02 LAB — VITAMIN B12: Vitamin B-12: 138 pg/mL — ABNORMAL LOW (ref 211–911)

## 2019-03-02 MED ORDER — TAMSULOSIN HCL 0.4 MG PO CAPS: 0.4000 mg | ORAL_CAPSULE | Freq: Every day | ORAL | 3 refills | Status: DC

## 2019-03-02 MED ORDER — VITAMIN D (ERGOCALCIFEROL) 1.25 MG (50000 UNIT) PO CAPS: 50000.0000 [IU] | ORAL_CAPSULE | ORAL | 0 refills | Status: DC

## 2019-03-02 NOTE — Patient Instructions (Addendum)
You had the Tdap tetanus shot today  Please take all new medication as prescribed - the flomax  Please continue all other medications as before, and refills have been done if requested.  Please have the pharmacy call with any other refills you may need.  Please continue your efforts at being more active, low cholesterol diet, and weight control.  You are otherwise up to date with prevention measures today.  Please keep your appointments with your specialists as you may have planned  Please go to the LAB in the Basement (turn left off the elevator) for the tests to be done today  You will be contacted by phone if any changes need to be made immediately.  Otherwise, you will receive a letter about your results with an explanation, but please check with MyChart first.  Please remember to sign up for MyChart if you have not done so, as this will be important to you in the future with finding out test results, communicating by private email, and scheduling acute appointments online when needed.  Please return in 6 months, or sooner if needed, with Lab testing done 3-5 days before
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
## 2019-03-03 ENCOUNTER — Telehealth: Payer: Self-pay

## 2019-03-03 NOTE — Telephone Encounter (Signed)
-----   Message from Biagio Borg, MD sent at 03/02/2019  5:01 PM EDT ----- Correction - Letter sent, cont same tx except  The test results show that your current treatment is OK, except the A1c is slightly higher, and the Vitamin B12 level is low.  The Vitamin D level is OK.  I will ask the office to call you to start monthly B12 shots until your next visit, when we may be able to change to B12 pills.  Shane Moreno to please inform pt, just needs B12 shots monthly for now

## 2019-03-03 NOTE — Telephone Encounter (Signed)
Pt has been informed of results and expressed understanding.   B12 injection has been scheduled.

## 2019-03-04 ENCOUNTER — Telehealth: Payer: Self-pay

## 2019-03-04 ENCOUNTER — Ambulatory Visit (INDEPENDENT_AMBULATORY_CARE_PROVIDER_SITE_OTHER): Payer: Medicare HMO

## 2019-03-04 DIAGNOSIS — E119 Type 2 diabetes mellitus without complications: Secondary | ICD-10-CM

## 2019-03-04 DIAGNOSIS — E538 Deficiency of other specified B group vitamins: Secondary | ICD-10-CM | POA: Diagnosis not present

## 2019-03-04 MED ORDER — CYANOCOBALAMIN 1000 MCG/ML IJ SOLN
1000.0000 ug | Freq: Once | INTRAMUSCULAR | Status: AC
  Administered 2019-03-04: 1000 ug via INTRAMUSCULAR

## 2019-03-04 NOTE — Addendum Note (Signed)
Addended by: Biagio Borg on: 03/04/2019 07:06 PM   Modules accepted: Orders

## 2019-03-04 NOTE — Telephone Encounter (Signed)
  We do not have any reading material as such here in the office but pt can research online Moreno diabetic diet.    Source Subject Topic  Shane Moreno (Patient) Shane Moreno (Patient) General - Other  Summary: dm diet sheet  Reason for CRM: pt had b12 injection today and pt would like Moreno dm diet sheet. Pt would like dm diet sheet to be mail to home address

## 2019-03-04 NOTE — Telephone Encounter (Signed)
Dr John please advise.  

## 2019-03-04 NOTE — Progress Notes (Signed)
Medical screening examination/treatment/procedure(s) were performed by non-physician practitioner and as supervising physician I was immediately available for consultation/collaboration. I agree with above. Lafern Brinkley, MD   

## 2019-03-04 NOTE — Telephone Encounter (Addendum)
[place] for DM education referral - done  We do not normally have the resources to conduct this education in our office

## 2019-03-05 ENCOUNTER — Encounter: Payer: Self-pay | Admitting: Internal Medicine

## 2019-03-05 DIAGNOSIS — E538 Deficiency of other specified B group vitamins: Secondary | ICD-10-CM | POA: Insufficient documentation

## 2019-03-05 DIAGNOSIS — R351 Nocturia: Secondary | ICD-10-CM | POA: Insufficient documentation

## 2019-03-05 NOTE — Assessment & Plan Note (Signed)
stable overall by history and exam, recent data reviewed with pt, and pt to continue medical treatment as before,  to f/u any worsening symptoms or concerns  

## 2019-03-05 NOTE — Assessment & Plan Note (Signed)
Also for iron panel, cbc with labs

## 2019-03-05 NOTE — Telephone Encounter (Signed)
Patient has been informed we have placed a referral for DM education. He is willing to go.

## 2019-03-05 NOTE — Telephone Encounter (Signed)
Called pt, LVM.   

## 2019-03-05 NOTE — Assessment & Plan Note (Signed)
For f/u b12, may need replacement

## 2019-03-05 NOTE — Assessment & Plan Note (Addendum)
C/w BPH related, for flomax asd,  to f/u any worsening symptoms or concerns  In addition to the time spent performing CPE, I spent an additional 25 minutes face to face,in which greater than 50% of this time was spent in counseling and coordination of care for patient's acute illness as documented, including the differential dx, treatment, further evaluation and other management of nocturia, iron def anemia, b12 defiency, HTN, DM

## 2019-03-05 NOTE — Assessment & Plan Note (Signed)

## 2019-04-05 ENCOUNTER — Telehealth: Payer: Self-pay | Admitting: Internal Medicine

## 2019-04-05 ENCOUNTER — Ambulatory Visit (INDEPENDENT_AMBULATORY_CARE_PROVIDER_SITE_OTHER): Payer: Medicare HMO

## 2019-04-05 ENCOUNTER — Other Ambulatory Visit: Payer: Self-pay | Admitting: Internal Medicine

## 2019-04-05 DIAGNOSIS — N400 Enlarged prostate without lower urinary tract symptoms: Secondary | ICD-10-CM

## 2019-04-05 DIAGNOSIS — E538 Deficiency of other specified B group vitamins: Secondary | ICD-10-CM | POA: Diagnosis not present

## 2019-04-05 MED ORDER — CYANOCOBALAMIN 1000 MCG/ML IJ SOLN
1000.0000 ug | Freq: Once | INTRAMUSCULAR | Status: AC
  Administered 2019-04-05: 1000 ug via INTRAMUSCULAR

## 2019-04-05 MED ORDER — ALFUZOSIN HCL ER 10 MG PO TB24: 10.0000 mg | ORAL_TABLET | Freq: Every day | ORAL | 3 refills | Status: DC

## 2019-04-05 NOTE — Telephone Encounter (Signed)
Pt has been informed.

## 2019-04-05 NOTE — Addendum Note (Signed)
Addended by: Biagio Borg on: 04/05/2019 11:44 AM   Modules accepted: Orders

## 2019-04-05 NOTE — Telephone Encounter (Signed)
Both done 

## 2019-04-05 NOTE — Telephone Encounter (Signed)
Pt declines referral. Would like an alternative med.

## 2019-04-05 NOTE — Telephone Encounter (Signed)
Rather than an expensive med from [facility], would he want to see urology first?

## 2019-04-05 NOTE — Telephone Encounter (Signed)
Patient would like to know if there is a length of time he is to stay on the b12 injections then possible transfer over to a pill form?

## 2019-04-05 NOTE — Telephone Encounter (Signed)
Pt stated Tamsulosin 4mg  isn't working for him and he want something else called in to [facility]

## 2019-04-05 NOTE — Progress Notes (Signed)
Medical screening examination/treatment/procedure(s) were performed by non-physician practitioner and as supervising physician I was immediately available for consultation/collaboration. I agree with above. Nea Gittens, MD   

## 2019-04-05 NOTE — Telephone Encounter (Signed)
About 6 months should be adequate, though we can do the pills if he wants, its just that the shots work better to start

## 2019-04-22 ENCOUNTER — Encounter: Payer: Self-pay | Admitting: *Deleted

## 2019-04-22 NOTE — Telephone Encounter (Signed)
Left message for pt to call to discuss changing 05-20-2019 visit to virtual.

## 2019-04-23 ENCOUNTER — Other Ambulatory Visit: Payer: Self-pay | Admitting: Cardiology

## 2019-04-23 ENCOUNTER — Other Ambulatory Visit: Payer: Self-pay | Admitting: Internal Medicine

## 2019-04-27 NOTE — Telephone Encounter (Signed)
This encounter was created in error - please disregard.

## 2019-05-03 ENCOUNTER — Other Ambulatory Visit: Payer: Self-pay | Admitting: Internal Medicine

## 2019-05-05 ENCOUNTER — Other Ambulatory Visit: Payer: Self-pay | Admitting: Internal Medicine

## 2019-05-06 ENCOUNTER — Ambulatory Visit (INDEPENDENT_AMBULATORY_CARE_PROVIDER_SITE_OTHER): Payer: Medicare HMO

## 2019-05-06 DIAGNOSIS — Z23 Encounter for immunization: Secondary | ICD-10-CM

## 2019-05-06 DIAGNOSIS — E538 Deficiency of other specified B group vitamins: Secondary | ICD-10-CM | POA: Diagnosis not present

## 2019-05-06 MED ORDER — CYANOCOBALAMIN 1000 MCG/ML IJ SOLN
1000.0000 ug | Freq: Once | INTRAMUSCULAR | Status: AC
  Administered 2019-05-06: 10:00:00 1000 ug via INTRAMUSCULAR

## 2019-05-06 NOTE — Progress Notes (Signed)
Medical screening examination/treatment/procedure(s) were performed by non-physician practitioner and as supervising physician I was immediately available for consultation/collaboration. I agree with above. Pang Robers, MD   
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
## 2019-05-20 ENCOUNTER — Encounter: Payer: Self-pay | Admitting: Cardiology

## 2019-05-20 ENCOUNTER — Telehealth (INDEPENDENT_AMBULATORY_CARE_PROVIDER_SITE_OTHER): Payer: Medicare HMO | Admitting: Cardiology

## 2019-05-20 VITALS — BP 112/65 | HR 70 | Ht 75.0 in | Wt 245.0 lb

## 2019-05-20 DIAGNOSIS — I10 Essential (primary) hypertension: Secondary | ICD-10-CM

## 2019-05-20 DIAGNOSIS — E7800 Pure hypercholesterolemia, unspecified: Secondary | ICD-10-CM

## 2019-05-20 DIAGNOSIS — I4821 Permanent atrial fibrillation: Secondary | ICD-10-CM

## 2019-05-20 DIAGNOSIS — I2510 Atherosclerotic heart disease of native coronary artery without angina pectoris: Secondary | ICD-10-CM | POA: Diagnosis not present

## 2019-05-20 NOTE — Patient Instructions (Signed)

## 2019-06-10 ENCOUNTER — Other Ambulatory Visit (INDEPENDENT_AMBULATORY_CARE_PROVIDER_SITE_OTHER): Payer: Medicare HMO

## 2019-06-10 ENCOUNTER — Encounter: Payer: Self-pay | Admitting: Internal Medicine

## 2019-06-10 ENCOUNTER — Ambulatory Visit (INDEPENDENT_AMBULATORY_CARE_PROVIDER_SITE_OTHER): Payer: Medicare HMO | Admitting: Internal Medicine

## 2019-06-10 ENCOUNTER — Other Ambulatory Visit: Payer: Self-pay

## 2019-06-10 VITALS — BP 118/76 | HR 52 | Temp 97.8°F | Ht 75.0 in | Wt 245.0 lb

## 2019-06-10 DIAGNOSIS — D696 Thrombocytopenia, unspecified: Secondary | ICD-10-CM | POA: Diagnosis not present

## 2019-06-10 DIAGNOSIS — E538 Deficiency of other specified B group vitamins: Secondary | ICD-10-CM

## 2019-06-10 DIAGNOSIS — E7800 Pure hypercholesterolemia, unspecified: Secondary | ICD-10-CM

## 2019-06-10 DIAGNOSIS — I10 Essential (primary) hypertension: Secondary | ICD-10-CM

## 2019-06-10 DIAGNOSIS — E119 Type 2 diabetes mellitus without complications: Secondary | ICD-10-CM

## 2019-06-10 DIAGNOSIS — Z0000 Encounter for general adult medical examination without abnormal findings: Secondary | ICD-10-CM

## 2019-06-10 LAB — CBC WITH DIFFERENTIAL/PLATELET
Basophils Absolute: 0 10*3/uL (ref 0.0–0.1)
Basophils Relative: 0.6 % (ref 0.0–3.0)
Eosinophils Absolute: 0.1 10*3/uL (ref 0.0–0.7)
Eosinophils Relative: 2.8 % (ref 0.0–5.0)
HCT: 40.9 % (ref 39.0–52.0)
Hemoglobin: 13.6 g/dL (ref 13.0–17.0)
Lymphocytes Relative: 22.1 % (ref 12.0–46.0)
Lymphs Abs: 1.2 10*3/uL (ref 0.7–4.0)
MCHC: 33.3 g/dL (ref 30.0–36.0)
MCV: 90.3 fl (ref 78.0–100.0)
Monocytes Absolute: 0.6 10*3/uL (ref 0.1–1.0)
Monocytes Relative: 10.7 % (ref 3.0–12.0)
Neutro Abs: 3.4 10*3/uL (ref 1.4–7.7)
Neutrophils Relative %: 63.8 % (ref 43.0–77.0)
Platelets: 92 10*3/uL — ABNORMAL LOW (ref 150.0–400.0)
RBC: 4.53 Mil/uL (ref 4.22–5.81)
RDW: 14.3 % (ref 11.5–15.5)
WBC: 5.3 10*3/uL (ref 4.0–10.5)

## 2019-06-10 LAB — HEPATIC FUNCTION PANEL
ALT: 10 U/L (ref 0–53)
AST: 13 U/L (ref 0–37)
Albumin: 4.5 g/dL (ref 3.5–5.2)
Alkaline Phosphatase: 43 U/L (ref 39–117)
Bilirubin, Direct: 0.2 mg/dL (ref 0.0–0.3)
Total Bilirubin: 1 mg/dL (ref 0.2–1.2)
Total Protein: 7.3 g/dL (ref 6.0–8.3)

## 2019-06-10 LAB — BASIC METABOLIC PANEL
BUN: 16 mg/dL (ref 6–23)
CO2: 25 mEq/L (ref 19–32)
Calcium: 9.5 mg/dL (ref 8.4–10.5)
Chloride: 106 mEq/L (ref 96–112)
Creatinine, Ser: 0.91 mg/dL (ref 0.40–1.50)
GFR: 80.69 mL/min (ref >60.00)
Glucose, Bld: 114 mg/dL — ABNORMAL HIGH (ref 70–99)
Potassium: 4.1 mEq/L (ref 3.5–5.1)
Sodium: 140 mEq/L (ref 135–145)

## 2019-06-10 LAB — LIPID PANEL
Cholesterol: 91 mg/dL (ref 0–200)
HDL: 45.3 mg/dL (ref >39.00)
LDL Cholesterol: 32 mg/dL (ref 0–99)
NonHDL: 45.75
Total CHOL/HDL Ratio: 2
Triglycerides: 70 mg/dL (ref 0.0–149.0)
VLDL: 14 mg/dL (ref 0.0–40.0)

## 2019-06-10 LAB — HEMOGLOBIN A1C: Hgb A1c MFr Bld: 7.5 % — ABNORMAL HIGH (ref 4.6–6.5)

## 2019-06-10 LAB — VITAMIN B12: Vitamin B-12: >1500 pg/mL — ABNORMAL HIGH (ref 211–911)

## 2019-06-10 MED ORDER — CYANOCOBALAMIN 1000 MCG/ML IJ SOLN
1000.0000 ug | Freq: Once | INTRAMUSCULAR | Status: AC
  Administered 2019-06-10: 1000 ug via INTRAMUSCULAR

## 2019-06-10 NOTE — Assessment & Plan Note (Signed)
stable overall by history and exam, recent data reviewed with pt, and pt to continue medical treatment as before,  to f/u any worsening symptoms or concerns  

## 2019-06-10 NOTE — Assessment & Plan Note (Signed)
With some trending down in the past yr - for f/u today

## 2019-06-10 NOTE — Patient Instructions (Signed)
You had the B12 shot today  Please continue all other medications as before, and refills have been done if requested.  Please have the pharmacy call with any other refills you may need.  Please continue your efforts at being more active, low cholesterol diet, and weight control.  Please keep your appointments with your specialists as you may have planned  Please go to the LAB in the Basement (turn left off the elevator) for the tests to be done today  You will be contacted by phone if any changes need to be made immediately.  Otherwise, you will receive a letter about your results with an explanation, but please check with MyChart first.  Please remember to sign up for MyChart if you have not done so, as this will be important to you in the future with finding out test results, communicating by private email, and scheduling acute appointments online when needed.  Please return in 6 months, or sooner if needed, with Lab testing done 3-5 days before

## 2019-06-10 NOTE — Assessment & Plan Note (Signed)
Improved by hx, for a1c with labs, stable overall by history and exam, recent data reviewed with pt, and pt to continue medical treatment as before,  to f/u any worsening symptoms or concerns
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
## 2019-07-24 ENCOUNTER — Other Ambulatory Visit: Payer: Self-pay | Admitting: Cardiology

## 2019-07-26 ENCOUNTER — Other Ambulatory Visit: Payer: Self-pay

## 2019-07-26 ENCOUNTER — Ambulatory Visit (INDEPENDENT_AMBULATORY_CARE_PROVIDER_SITE_OTHER): Payer: Medicare HMO

## 2019-07-26 ENCOUNTER — Telehealth: Payer: Self-pay

## 2019-07-26 DIAGNOSIS — E538 Deficiency of other specified B group vitamins: Secondary | ICD-10-CM

## 2019-07-26 DIAGNOSIS — D696 Thrombocytopenia, unspecified: Secondary | ICD-10-CM

## 2019-07-26 MED ORDER — CYANOCOBALAMIN 1000 MCG/ML IJ SOLN
1000.0000 ug | Freq: Once | INTRAMUSCULAR | Status: AC
  Administered 2019-07-26: 1000 ug via INTRAMUSCULAR

## 2019-07-26 NOTE — Telephone Encounter (Signed)
The last 2 encounters for lab work has shown patient's platelet count continues to be low----in the last letter dr Jenny Reichmann sent to patient, no advisement was given for what patient needs to do about low platelet count, routing to dr Jenny Reichmann, can you please advise if patient needs to do something further about lab work (low platelet count), I will call patient back, thanks

## 2019-07-26 NOTE — Progress Notes (Signed)
Medical screening examination/treatment/procedure(s) were performed by non-physician practitioner and as supervising physician I was immediately available for consultation/collaboration. I agree with above. Catalyna Reilly, MD   

## 2019-07-26 NOTE — Addendum Note (Signed)
Addended by: Biagio Borg on: 07/26/2019 09:26 AM   Modules accepted: Orders

## 2019-07-26 NOTE — Telephone Encounter (Signed)
Patient advised that dept will call him to schedule appt

## 2019-07-26 NOTE — Telephone Encounter (Signed)
plts are a bit lower than previous, ok for referral hematology - I will do

## 2019-07-28 ENCOUNTER — Telehealth: Payer: Self-pay | Admitting: Hematology and Oncology

## 2019-07-28 NOTE — Telephone Encounter (Signed)
Received a new hem referral from Dr. Jenny Reichmann for thrombocytopenia. Shane Moreno has been cld and scheduled to see Shane Moreno on 11/30 at 9am. Pt aware to arrive 15 minutes early. Address and insurance has been verified.
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
## 2019-08-09 ENCOUNTER — Inpatient Hospital Stay: Payer: Medicare HMO | Attending: Hematology and Oncology | Admitting: Hematology and Oncology

## 2019-08-09 ENCOUNTER — Other Ambulatory Visit: Payer: Self-pay

## 2019-08-09 ENCOUNTER — Inpatient Hospital Stay: Payer: Medicare HMO

## 2019-08-09 VITALS — BP 106/63 | HR 52 | Temp 97.8°F | Resp 18 | Ht 75.0 in | Wt 245.8 lb

## 2019-08-09 DIAGNOSIS — D696 Thrombocytopenia, unspecified: Secondary | ICD-10-CM

## 2019-08-09 DIAGNOSIS — I4891 Unspecified atrial fibrillation: Secondary | ICD-10-CM | POA: Diagnosis not present

## 2019-08-09 DIAGNOSIS — D509 Iron deficiency anemia, unspecified: Secondary | ICD-10-CM | POA: Diagnosis not present

## 2019-08-09 DIAGNOSIS — E119 Type 2 diabetes mellitus without complications: Secondary | ICD-10-CM

## 2019-08-09 LAB — CMP (CANCER CENTER ONLY)
ALT: 11 U/L (ref 0–44)
AST: 13 U/L — ABNORMAL LOW (ref 15–41)
Albumin: 4.1 g/dL (ref 3.5–5.0)
Alkaline Phosphatase: 52 U/L (ref 38–126)
Anion gap: 10 (ref 5–15)
BUN: 21 mg/dL (ref 8–23)
CO2: 23 mmol/L (ref 22–32)
Calcium: 9.1 mg/dL (ref 8.9–10.3)
Chloride: 108 mmol/L (ref 98–111)
Creatinine: 1 mg/dL (ref 0.61–1.24)
GFR, Est AFR Am: >60 mL/min (ref >60)
GFR, Estimated: >60 mL/min (ref >60)
Glucose, Bld: 170 mg/dL — ABNORMAL HIGH (ref 70–99)
Potassium: 4.5 mmol/L (ref 3.5–5.1)
Sodium: 141 mmol/L (ref 135–145)
Total Bilirubin: 0.8 mg/dL (ref 0.3–1.2)
Total Protein: 7.1 g/dL (ref 6.5–8.1)

## 2019-08-09 LAB — FOLATE: Folate: 9.6 ng/mL (ref >5.9)

## 2019-08-09 LAB — RETIC PANEL
Immature Retic Fract: 9.6 % (ref 2.3–15.9)
RBC.: 4.47 MIL/uL (ref 4.22–5.81)
Retic Count, Absolute: 58.6 10*3/uL (ref 19.0–186.0)
Retic Ct Pct: 1.3 % (ref 0.4–3.1)
Reticulocyte Hemoglobin: 34.8 pg (ref >27.9)

## 2019-08-09 LAB — CBC WITH DIFFERENTIAL (CANCER CENTER ONLY)
Abs Immature Granulocytes: 0.01 10*3/uL (ref 0.00–0.07)
Basophils Absolute: 0 10*3/uL (ref 0.0–0.1)
Basophils Relative: 1 %
Eosinophils Absolute: 0.1 10*3/uL (ref 0.0–0.5)
Eosinophils Relative: 3 %
HCT: 41.1 % (ref 39.0–52.0)
Hemoglobin: 13.3 g/dL (ref 13.0–17.0)
Immature Granulocytes: 0 %
Lymphocytes Relative: 18 %
Lymphs Abs: 0.9 10*3/uL (ref 0.7–4.0)
MCH: 29.6 pg (ref 26.0–34.0)
MCHC: 32.4 g/dL (ref 30.0–36.0)
MCV: 91.5 fL (ref 80.0–100.0)
Monocytes Absolute: 0.5 10*3/uL (ref 0.1–1.0)
Monocytes Relative: 11 %
Neutro Abs: 3.3 10*3/uL (ref 1.7–7.7)
Neutrophils Relative %: 67 %
Platelet Count: 105 10*3/uL — ABNORMAL LOW (ref 150–400)
RBC: 4.49 MIL/uL (ref 4.22–5.81)
RDW: 14.2 % (ref 11.5–15.5)
WBC Count: 5 10*3/uL (ref 4.0–10.5)
nRBC: 0 % (ref 0.0–0.2)

## 2019-08-09 LAB — HEPATITIS B SURFACE ANTIGEN: Hepatitis B Surface Ag: NONREACTIVE

## 2019-08-09 LAB — SAVE SMEAR(SSMR), FOR PROVIDER SLIDE REVIEW

## 2019-08-09 LAB — HEPATITIS B CORE ANTIBODY, TOTAL: Hep B Core Total Ab: NONREACTIVE

## 2019-08-09 LAB — HEPATITIS B SURFACE ANTIBODY,QUALITATIVE: Hep B S Ab: NONREACTIVE

## 2019-08-09 LAB — HIV ANTIBODY (ROUTINE TESTING W REFLEX): HIV Screen 4th Generation wRfx: NONREACTIVE

## 2019-08-09 LAB — HEPATITIS C ANTIBODY: HCV Ab: NONREACTIVE

## 2019-08-09 LAB — PLATELET BY CITRATE

## 2019-08-10 LAB — PROTEIN ELECTROPHORESIS, SERUM
A/G Ratio: 1.5 (ref 0.7–1.7)
Albumin ELP: 4 g/dL (ref 2.9–4.4)
Alpha-1-Globulin: 0.2 g/dL (ref 0.0–0.4)
Alpha-2-Globulin: 0.7 g/dL (ref 0.4–1.0)
Beta Globulin: 0.9 g/dL (ref 0.7–1.3)
Gamma Globulin: 0.9 g/dL (ref 0.4–1.8)
Globulin, Total: 2.7 g/dL (ref 2.2–3.9)
Total Protein ELP: 6.7 g/dL (ref 6.0–8.5)

## 2019-08-10 LAB — KAPPA/LAMBDA LIGHT CHAINS
Kappa free light chain: 23.5 mg/L — ABNORMAL HIGH (ref 3.3–19.4)
Kappa, lambda light chain ratio: 1.37 (ref 0.26–1.65)
Lambda free light chains: 17.1 mg/L (ref 5.7–26.3)

## 2019-08-16 ENCOUNTER — Ambulatory Visit (HOSPITAL_COMMUNITY)
Admission: RE | Admit: 2019-08-16 | Discharge: 2019-08-16 | Disposition: A | Discharge status: Home / Self Care | Payer: Medicare HMO | Source: Ambulatory Visit | Attending: Hematology and Oncology | Admitting: Hematology and Oncology

## 2019-08-16 ENCOUNTER — Other Ambulatory Visit: Payer: Self-pay

## 2019-08-16 DIAGNOSIS — D696 Thrombocytopenia, unspecified: Secondary | ICD-10-CM | POA: Insufficient documentation

## 2019-08-16 DIAGNOSIS — R161 Splenomegaly, not elsewhere classified: Secondary | ICD-10-CM | POA: Diagnosis not present

## 2019-08-18 ENCOUNTER — Telehealth: Payer: Self-pay | Admitting: Hematology and Oncology

## 2019-08-18 NOTE — Telephone Encounter (Signed)
Called Shane Moreno to discuss the results of the [place] from earlier this week.   Abdominal US showed a mildly enlarged spleen (14cm) with no hepatic abnormalities noted. An incidental 1.4cm splenic lesion was identified and a 6 month f/u US was recommended to assure it was not growing.   Plan to f/u with patient in approximately 2-3 months to assure platelet count is stable and discuss the utility of a bone marrow biopsy. At this time, I do not favor a Bmbx as this is mild isolated thrombocytopenia. Medication side effect is possible, though he is not on any medications classically associated with thrombocytopenia.  Ledell Peoples, MD Department of Hematology/Oncology [place] at [facility] Phone: [phone] Pager: [phone] Email: [email or web address]

## 2019-08-25 ENCOUNTER — Ambulatory Visit: Payer: Medicare HMO

## 2019-08-26 ENCOUNTER — Other Ambulatory Visit: Payer: Self-pay

## 2019-08-26 ENCOUNTER — Ambulatory Visit (INDEPENDENT_AMBULATORY_CARE_PROVIDER_SITE_OTHER): Payer: Medicare HMO

## 2019-08-26 DIAGNOSIS — E538 Deficiency of other specified B group vitamins: Secondary | ICD-10-CM | POA: Diagnosis not present

## 2019-08-26 MED ORDER — CYANOCOBALAMIN 1000 MCG/ML IJ SOLN
1000.0000 ug | Freq: Once | INTRAMUSCULAR | Status: AC
  Administered 2019-08-26: 1000 ug via INTRAMUSCULAR

## 2019-08-31 NOTE — Progress Notes (Signed)
Medical screening examination/treatment/procedure(s) were performed by non-physician practitioner and as supervising physician I was immediately available for consultation/collaboration. I agree with above. Bradey Luzier, MD   

## 2019-09-01 ENCOUNTER — Ambulatory Visit: Payer: Medicare HMO | Admitting: Internal Medicine

## 2019-09-07 ENCOUNTER — Telehealth: Payer: Self-pay | Admitting: Hematology and Oncology

## 2019-09-07 NOTE — Telephone Encounter (Signed)
Scheduled per 11/30 los. Called and spoke with pt, confirmed 1/29 appt

## 2019-09-27 ENCOUNTER — Ambulatory Visit (INDEPENDENT_AMBULATORY_CARE_PROVIDER_SITE_OTHER): Payer: Medicare HMO | Admitting: *Deleted

## 2019-09-27 ENCOUNTER — Telehealth: Payer: Self-pay | Admitting: *Deleted

## 2019-09-27 ENCOUNTER — Other Ambulatory Visit: Payer: Self-pay

## 2019-09-27 DIAGNOSIS — E538 Deficiency of other specified B group vitamins: Secondary | ICD-10-CM

## 2019-09-27 MED ORDER — CYANOCOBALAMIN 1000 MCG/ML IJ SOLN
1000.0000 ug | Freq: Once | INTRAMUSCULAR | Status: AC
  Administered 2019-09-27: 1000 ug via INTRAMUSCULAR

## 2019-09-27 NOTE — Telephone Encounter (Signed)
b12 added to feb labs, will see after that

## 2019-09-27 NOTE — Telephone Encounter (Signed)
Pt came in for her #6 B12 inj.. and he is wanting to know if MD is going to recheck level and if  he need to continue taking...Raechel Chute

## 2019-09-27 NOTE — Telephone Encounter (Signed)
Notified pt w/MD response. Made lab appt for 10/11/19.Marland KitchenRaechel Chute

## 2019-10-02 NOTE — Progress Notes (Signed)
Medical screening examination/treatment/procedure(s) were performed by non-physician practitioner and as supervising physician I was immediately available for consultation/collaboration. I agree with above. Millette Halberstam, MD   

## 2019-10-06 ENCOUNTER — Telehealth: Payer: Self-pay | Admitting: *Deleted

## 2019-10-06 NOTE — Telephone Encounter (Signed)
Received vm message from patient stating that he needed his appt with Dr. Leonides Schanz re-scheduled as he was having issues with his insurance.  Message sent to scheduling.

## 2019-10-08 ENCOUNTER — Ambulatory Visit: Payer: Medicare HMO | Admitting: Hematology and Oncology

## 2019-10-11 ENCOUNTER — Other Ambulatory Visit: Payer: Medicare Other

## 2019-10-14 ENCOUNTER — Telehealth: Payer: Self-pay | Admitting: Hematology and Oncology

## 2019-10-14 NOTE — Telephone Encounter (Signed)
Rescheduled per 2/4 sch msg, pt req. Called and spoke with pt, confirmed 2/18 appt

## 2019-10-15 ENCOUNTER — Other Ambulatory Visit (INDEPENDENT_AMBULATORY_CARE_PROVIDER_SITE_OTHER): Payer: Medicare HMO

## 2019-10-15 ENCOUNTER — Other Ambulatory Visit: Payer: Self-pay | Admitting: Internal Medicine

## 2019-10-15 ENCOUNTER — Other Ambulatory Visit: Payer: Self-pay

## 2019-10-15 DIAGNOSIS — E538 Deficiency of other specified B group vitamins: Secondary | ICD-10-CM | POA: Diagnosis not present

## 2019-10-15 DIAGNOSIS — E119 Type 2 diabetes mellitus without complications: Secondary | ICD-10-CM

## 2019-10-15 DIAGNOSIS — Z0000 Encounter for general adult medical examination without abnormal findings: Secondary | ICD-10-CM | POA: Diagnosis not present

## 2019-10-15 LAB — URINALYSIS, ROUTINE W REFLEX MICROSCOPIC
Bilirubin Urine: NEGATIVE
Hgb urine dipstick: NEGATIVE
Ketones, ur: NEGATIVE
Leukocytes,Ua: NEGATIVE
Nitrite: NEGATIVE
RBC / HPF: NONE SEEN (ref 0–?)
Specific Gravity, Urine: 1.02 (ref 1.000–1.030)
Total Protein, Urine: NEGATIVE
Urine Glucose: >=1000 — AB
Urobilinogen, UA: 0.2 (ref 0.0–1.0)
pH: 6.5 (ref 5.0–8.0)

## 2019-10-15 LAB — LIPID PANEL
Cholesterol: 112 mg/dL (ref 0–200)
HDL: 42.5 mg/dL (ref >39.00)
LDL Cholesterol: 46 mg/dL (ref 0–99)
NonHDL: 69.44
Total CHOL/HDL Ratio: 3
Triglycerides: 118 mg/dL (ref 0.0–149.0)
VLDL: 23.6 mg/dL (ref 0.0–40.0)

## 2019-10-15 LAB — BASIC METABOLIC PANEL
BUN: 29 mg/dL — ABNORMAL HIGH (ref 6–23)
CO2: 29 mEq/L (ref 19–32)
Calcium: 9.4 mg/dL (ref 8.4–10.5)
Chloride: 104 mEq/L (ref 96–112)
Creatinine, Ser: 0.9 mg/dL (ref 0.40–1.50)
GFR: 81.65 mL/min (ref >60.00)
Glucose, Bld: 124 mg/dL — ABNORMAL HIGH (ref 70–99)
Potassium: 4.2 mEq/L (ref 3.5–5.1)
Sodium: 139 mEq/L (ref 135–145)

## 2019-10-15 LAB — CBC WITH DIFFERENTIAL/PLATELET
Basophils Absolute: 0 10*3/uL (ref 0.0–0.1)
Basophils Relative: 0.6 % (ref 0.0–3.0)
Eosinophils Absolute: 0.1 10*3/uL (ref 0.0–0.7)
Eosinophils Relative: 2.7 % (ref 0.0–5.0)
HCT: 41.2 % (ref 39.0–52.0)
Hemoglobin: 13.6 g/dL (ref 13.0–17.0)
Lymphocytes Relative: 19.1 % (ref 12.0–46.0)
Lymphs Abs: 0.8 10*3/uL (ref 0.7–4.0)
MCHC: 33 g/dL (ref 30.0–36.0)
MCV: 90.2 fl (ref 78.0–100.0)
Monocytes Absolute: 0.4 10*3/uL (ref 0.1–1.0)
Monocytes Relative: 9.8 % (ref 3.0–12.0)
Neutro Abs: 3 10*3/uL (ref 1.4–7.7)
Neutrophils Relative %: 67.8 % (ref 43.0–77.0)
Platelets: 85 10*3/uL — ABNORMAL LOW (ref 150.0–400.0)
RBC: 4.57 Mil/uL (ref 4.22–5.81)
RDW: 14.8 % (ref 11.5–15.5)
WBC: 4.4 10*3/uL (ref 4.0–10.5)

## 2019-10-15 LAB — MICROALBUMIN / CREATININE URINE RATIO
Creatinine,U: 60.5 mg/dL
Microalb Creat Ratio: 2.7 mg/g (ref 0.0–30.0)
Microalb, Ur: 1.7 mg/dL (ref 0.0–1.9)

## 2019-10-15 LAB — VITAMIN B12: Vitamin B-12: 267 pg/mL (ref 211–911)

## 2019-10-15 LAB — HEPATIC FUNCTION PANEL
ALT: 11 U/L (ref 0–53)
AST: 13 U/L (ref 0–37)
Albumin: 4.5 g/dL (ref 3.5–5.2)
Alkaline Phosphatase: 49 U/L (ref 39–117)
Bilirubin, Direct: 0.2 mg/dL (ref 0.0–0.3)
Total Bilirubin: 0.8 mg/dL (ref 0.2–1.2)
Total Protein: 7.2 g/dL (ref 6.0–8.3)

## 2019-10-15 LAB — PSA: PSA: 0.46 ng/mL (ref 0.10–4.00)

## 2019-10-15 LAB — HEMOGLOBIN A1C: Hgb A1c MFr Bld: 7.9 % — ABNORMAL HIGH (ref 4.6–6.5)

## 2019-10-15 LAB — TSH: TSH: 1.87 u[IU]/mL (ref 0.35–4.50)

## 2019-10-15 NOTE — Telephone Encounter (Signed)
Please refill as per office routine med refill policy (all routine meds refilled for 3 mo or monthly per pt preference up to one year from last visit, then month to month grace period for 3 mo, then further med refills will have to be denied)  

## 2019-10-24 ENCOUNTER — Encounter: Payer: Self-pay | Admitting: Internal Medicine

## 2019-10-26 ENCOUNTER — Telehealth: Payer: Self-pay | Admitting: Internal Medicine

## 2019-10-26 NOTE — Telephone Encounter (Signed)
    Patient calling for lab results 

## 2019-10-26 NOTE — Telephone Encounter (Signed)
Called patient left voicemail for a return phone call to office

## 2019-10-27 ENCOUNTER — Telehealth: Payer: Self-pay | Admitting: Hematology and Oncology

## 2019-10-27 NOTE — Telephone Encounter (Signed)
Called and spoke with patient explained message. He said that currently he doesn't want to see an Endocrinologist at this present time.   He also said that he didn't want to schedule an office visit at this time.Marland KitchenMarland KitchenPatient said that he has currently started a new diet in hopes this will help

## 2019-10-27 NOTE — Telephone Encounter (Signed)
° ° °  Please return call to patient on home phone °

## 2019-10-27 NOTE — Telephone Encounter (Signed)
Rescheduled per 2/16 sch msg. Called and left a msg. Mailing printout

## 2019-10-28 ENCOUNTER — Ambulatory Visit: Payer: Self-pay | Admitting: Hematology and Oncology

## 2019-10-28 ENCOUNTER — Other Ambulatory Visit: Payer: Self-pay

## 2019-11-08 ENCOUNTER — Inpatient Hospital Stay: Payer: Medicare HMO | Admitting: Hematology and Oncology

## 2019-11-08 ENCOUNTER — Inpatient Hospital Stay: Payer: Medicare HMO | Attending: Hematology and Oncology

## 2019-11-08 ENCOUNTER — Other Ambulatory Visit: Payer: Self-pay

## 2019-11-08 ENCOUNTER — Other Ambulatory Visit: Payer: Self-pay | Admitting: Hematology and Oncology

## 2019-11-08 ENCOUNTER — Encounter: Payer: Self-pay | Admitting: Hematology and Oncology

## 2019-11-08 VITALS — BP 90/49 | HR 83 | Temp 98.2°F | Resp 18 | Ht 75.0 in | Wt 246.8 lb

## 2019-11-08 DIAGNOSIS — D696 Thrombocytopenia, unspecified: Secondary | ICD-10-CM | POA: Diagnosis not present

## 2019-11-08 DIAGNOSIS — Z7901 Long term (current) use of anticoagulants: Secondary | ICD-10-CM | POA: Diagnosis not present

## 2019-11-08 DIAGNOSIS — D693 Immune thrombocytopenic purpura: Secondary | ICD-10-CM | POA: Insufficient documentation

## 2019-11-08 DIAGNOSIS — I4891 Unspecified atrial fibrillation: Secondary | ICD-10-CM | POA: Diagnosis not present

## 2019-11-08 DIAGNOSIS — I10 Essential (primary) hypertension: Secondary | ICD-10-CM | POA: Insufficient documentation

## 2019-11-08 DIAGNOSIS — E119 Type 2 diabetes mellitus without complications: Secondary | ICD-10-CM | POA: Diagnosis not present

## 2019-11-08 LAB — CBC WITH DIFFERENTIAL (CANCER CENTER ONLY)
Abs Immature Granulocytes: 0 10*3/uL (ref 0.00–0.07)
Basophils Absolute: 0.1 10*3/uL (ref 0.0–0.1)
Basophils Relative: 1 %
Eosinophils Absolute: 0.2 10*3/uL (ref 0.0–0.5)
Eosinophils Relative: 3 %
HCT: 40.6 % (ref 39.0–52.0)
Hemoglobin: 13.1 g/dL (ref 13.0–17.0)
Immature Granulocytes: 0 %
Lymphocytes Relative: 25 %
Lymphs Abs: 1.2 10*3/uL (ref 0.7–4.0)
MCH: 29.7 pg (ref 26.0–34.0)
MCHC: 32.3 g/dL (ref 30.0–36.0)
MCV: 92.1 fL (ref 80.0–100.0)
Monocytes Absolute: 0.6 10*3/uL (ref 0.1–1.0)
Monocytes Relative: 12 %
Neutro Abs: 2.8 10*3/uL (ref 1.7–7.7)
Neutrophils Relative %: 59 %
Platelet Count: 93 10*3/uL — ABNORMAL LOW (ref 150–400)
RBC: 4.41 MIL/uL (ref 4.22–5.81)
RDW: 14.5 % (ref 11.5–15.5)
WBC Count: 4.8 10*3/uL (ref 4.0–10.5)
nRBC: 0 % (ref 0.0–0.2)

## 2019-11-08 LAB — CMP (CANCER CENTER ONLY)
ALT: 11 U/L (ref 0–44)
AST: 17 U/L (ref 15–41)
Albumin: 4 g/dL (ref 3.5–5.0)
Alkaline Phosphatase: 45 U/L (ref 38–126)
Anion gap: 7 (ref 5–15)
BUN: 27 mg/dL — ABNORMAL HIGH (ref 8–23)
CO2: 26 mmol/L (ref 22–32)
Calcium: 9 mg/dL (ref 8.9–10.3)
Chloride: 106 mmol/L (ref 98–111)
Creatinine: 1.12 mg/dL (ref 0.61–1.24)
GFR, Est AFR Am: >60 mL/min (ref >60)
GFR, Estimated: >60 mL/min (ref >60)
Glucose, Bld: 159 mg/dL — ABNORMAL HIGH (ref 70–99)
Potassium: 4.3 mmol/L (ref 3.5–5.1)
Sodium: 139 mmol/L (ref 135–145)
Total Bilirubin: 0.8 mg/dL (ref 0.3–1.2)
Total Protein: 7 g/dL (ref 6.5–8.1)

## 2019-11-08 LAB — SAVE SMEAR(SSMR), FOR PROVIDER SLIDE REVIEW
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
## 2019-11-09 ENCOUNTER — Telehealth: Payer: Self-pay | Admitting: Hematology and Oncology

## 2019-11-09 NOTE — Telephone Encounter (Signed)
Scheduled per 3/1 los. Called and spoke with patient. Confirmed appt  

## 2019-11-18 DIAGNOSIS — H2513 Age-related nuclear cataract, bilateral: Secondary | ICD-10-CM | POA: Diagnosis not present

## 2019-11-29 ENCOUNTER — Telehealth: Payer: Self-pay | Admitting: Internal Medicine

## 2019-11-29 ENCOUNTER — Other Ambulatory Visit: Payer: Self-pay

## 2019-11-29 ENCOUNTER — Encounter: Payer: Self-pay | Admitting: Internal Medicine

## 2019-11-29 ENCOUNTER — Ambulatory Visit (INDEPENDENT_AMBULATORY_CARE_PROVIDER_SITE_OTHER): Payer: Medicare HMO | Admitting: Internal Medicine

## 2019-11-29 DIAGNOSIS — B029 Zoster without complications: Secondary | ICD-10-CM

## 2019-11-29 DIAGNOSIS — I10 Essential (primary) hypertension: Secondary | ICD-10-CM

## 2019-11-29 DIAGNOSIS — E119 Type 2 diabetes mellitus without complications: Secondary | ICD-10-CM

## 2019-11-29 MED ORDER — HYDROCODONE-ACETAMINOPHEN 5-325 MG PO TABS: 1.0000 | ORAL_TABLET | Freq: Four times a day (QID) | ORAL | 0 refills | Status: DC | PRN

## 2019-11-29 MED ORDER — VALACYCLOVIR HCL 1 G PO TABS: 1000.0000 mg | ORAL_TABLET | Freq: Three times a day (TID) | ORAL | 0 refills | Status: DC

## 2019-11-29 MED ORDER — GABAPENTIN 100 MG PO CAPS: 100.0000 mg | ORAL_CAPSULE | Freq: Three times a day (TID) | ORAL | 3 refills | Status: DC
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
## 2019-11-29 NOTE — Assessment & Plan Note (Signed)
For valtrex, hydrocodone prn, gabapentin 100 tid, and contd topical lidocaine

## 2019-11-29 NOTE — Assessment & Plan Note (Signed)
stable overall by history and exam, recent data reviewed with pt, and pt to continue medical treatment as before,  to f/u any worsening symptoms or concerns  

## 2019-11-29 NOTE — Assessment & Plan Note (Signed)
Improved, ok stay off metformin, f/u lab next visit

## 2019-11-29 NOTE — Patient Instructions (Signed)
Please take all new medication as prescribed - the valtrex antibiotic, the hydrocodone for pain, and gabapentin for pain  Please continue all other medications as before, including the lidocaine  Please have the pharmacy call with any other refills you may need.  Please continue your efforts at being more active, low cholesterol diet, and weight control.  Please keep your appointments with your specialists as you may have planned

## 2019-11-29 NOTE — Telephone Encounter (Signed)
   Pt c/o medication issue:  1. Name of Medication: HYDROcodone-acetaminophen (NORCO/VICODIN) 5-325 MG tablet  2. How are you currently taking this medication (dosage and times per day)? As written  3. Are you having a reaction (difficulty breathing--STAT)? no  4. What is your medication issue?  Please call [facility] [phone] to clarify

## 2019-12-01 NOTE — Telephone Encounter (Signed)
Denies,  vicodin already done erx mar 22

## 2019-12-06 NOTE — Telephone Encounter (Signed)
PA stated that medication is available without an prior auth.   Call to [facility] and tech stated that patient has already picked up medication.

## 2019-12-06 NOTE — Telephone Encounter (Signed)
Key: [id number]

## 2019-12-08 ENCOUNTER — Telehealth: Payer: Self-pay | Admitting: Internal Medicine

## 2019-12-08 NOTE — Telephone Encounter (Signed)
    Patient calling to discuss empagliflozin (JARDIANCE) 25 MG TABS tablet assistance application; missing pages

## 2019-12-09 ENCOUNTER — Ambulatory Visit: Payer: Medicare HMO | Admitting: Internal Medicine

## 2019-12-15 NOTE — Telephone Encounter (Signed)
Can you call [employer] at [phone] and ask what is missing or if they just want the whole form resent. I have it whichever they need.   Call pt and inform of same too, please

## 2019-12-15 NOTE — Telephone Encounter (Signed)
Called and spoke with Darl Pikes at [facility]. She states that there was not any missing pages, that the patient was approved on 12/14/2019 until 09/08/2020. States that the empagliflozin (JARDIANCE) 25 MG TABS tablet is due to ship on out 12/20/2019.  Called patient and LVM in regards to shipment being sent out on Monday 12/20/2019 and to call if he has any further questions.

## 2019-12-31 ENCOUNTER — Telehealth: Payer: Self-pay | Admitting: *Deleted

## 2019-12-31 NOTE — Telephone Encounter (Signed)
Patient assistance for eliquis has been approved.

## 2020-01-18 ENCOUNTER — Other Ambulatory Visit: Payer: Self-pay | Admitting: Internal Medicine

## 2020-02-12 ENCOUNTER — Other Ambulatory Visit: Payer: Self-pay | Admitting: Internal Medicine

## 2020-02-13 NOTE — Telephone Encounter (Signed)
Please refill as per office routine med refill policy (all routine meds refilled for 3 mo or monthly per pt preference up to one year from last visit, then month to month grace period for 3 mo, then further med refills will have to be denied)  

## 2020-02-21 ENCOUNTER — Ambulatory Visit: Payer: Medicare HMO | Admitting: Internal Medicine

## 2020-03-30 ENCOUNTER — Ambulatory Visit (INDEPENDENT_AMBULATORY_CARE_PROVIDER_SITE_OTHER): Payer: Medicare HMO | Admitting: Internal Medicine

## 2020-03-30 ENCOUNTER — Other Ambulatory Visit: Payer: Self-pay

## 2020-03-30 ENCOUNTER — Encounter: Payer: Self-pay | Admitting: Internal Medicine

## 2020-03-30 VITALS — BP 130/78 | HR 65 | Temp 98.5°F | Ht 75.0 in | Wt 234.0 lb

## 2020-03-30 DIAGNOSIS — Z0000 Encounter for general adult medical examination without abnormal findings: Secondary | ICD-10-CM | POA: Diagnosis not present

## 2020-03-30 DIAGNOSIS — R600 Localized edema: Secondary | ICD-10-CM | POA: Insufficient documentation

## 2020-03-30 DIAGNOSIS — E538 Deficiency of other specified B group vitamins: Secondary | ICD-10-CM

## 2020-03-30 DIAGNOSIS — Z0001 Encounter for general adult medical examination with abnormal findings: Secondary | ICD-10-CM

## 2020-03-30 DIAGNOSIS — N401 Enlarged prostate with lower urinary tract symptoms: Secondary | ICD-10-CM | POA: Diagnosis not present

## 2020-03-30 DIAGNOSIS — R351 Nocturia: Secondary | ICD-10-CM

## 2020-03-30 DIAGNOSIS — I10 Essential (primary) hypertension: Secondary | ICD-10-CM

## 2020-03-30 DIAGNOSIS — E119 Type 2 diabetes mellitus without complications: Secondary | ICD-10-CM

## 2020-03-30 DIAGNOSIS — R609 Edema, unspecified: Secondary | ICD-10-CM

## 2020-03-30 DIAGNOSIS — D696 Thrombocytopenia, unspecified: Secondary | ICD-10-CM | POA: Diagnosis not present

## 2020-03-30 LAB — COMPLETE METABOLIC PANEL WITH GFR
AG Ratio: 1.7 (calc) (ref 1.0–2.5)
ALT: 9 U/L (ref 9–46)
AST: 18 U/L (ref 10–35)
Albumin: 4.5 g/dL (ref 3.6–5.1)
Alkaline phosphatase (APISO): 53 U/L (ref 35–144)
BUN: 20 mg/dL (ref 7–25)
CO2: 25 mmol/L (ref 20–32)
Calcium: 9.3 mg/dL (ref 8.6–10.3)
Chloride: 107 mmol/L (ref 98–110)
Creat: 0.89 mg/dL (ref 0.70–1.18)
GFR, Est African American: 95 mL/min/{1.73_m2} (ref >=60)
GFR, Est Non African American: 82 mL/min/{1.73_m2} (ref >=60)
Globulin: 2.6 g/dL (calc) (ref 1.9–3.7)
Glucose, Bld: 79 mg/dL (ref 65–99)
Potassium: 4.5 mmol/L (ref 3.5–5.3)
Sodium: 141 mmol/L (ref 135–146)
Total Bilirubin: 1 mg/dL (ref 0.2–1.2)
Total Protein: 7.1 g/dL (ref 6.1–8.1)

## 2020-03-30 MED ORDER — HYDROCHLOROTHIAZIDE 12.5 MG PO CAPS: 12.5000 mg | ORAL_CAPSULE | Freq: Every day | ORAL | 3 refills | Status: DC

## 2020-03-30 NOTE — Patient Instructions (Addendum)
Ok to stop the glipizide since your sugars are so good after the weight loss  Please take all new medication as prescribed  - the very mild low dose fluid pill for the swelling  Please continue all other medications as before, and refills have been done if requested.  Please have the pharmacy call with any other refills you may need.  Please continue your efforts at being more active, low cholesterol diet, and weight control.  You are otherwise up to date with prevention measures today.  Please keep your appointments with your specialists as you may have planned  - cardiology, and pulmonary,, and hematology oncology  You will be contacted regarding the referral for: urology  Please go to the LAB at the blood drawing area for the tests to be done  You will be contacted by phone if any changes need to be made immediately.  Otherwise, you will receive a letter about your results with an explanation, but please check with MyChart first.  Please remember to sign up for MyChart if you have not done so, as this will be important to you in the future with finding out test results, communicating by private email, and scheduling acute appointments online when needed.  Please make an Appointment to return in 6 months, or sooner if needed
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
## 2020-03-31 ENCOUNTER — Encounter: Payer: Self-pay | Admitting: Internal Medicine

## 2020-03-31 LAB — MICROALBUMIN / CREATININE URINE RATIO
Creatinine, Urine: 76 mg/dL (ref 20–320)
Microalb Creat Ratio: 37 mcg/mg creat — ABNORMAL HIGH (ref <30)
Microalb, Ur: 2.8 mg/dL

## 2020-03-31 LAB — CBC WITH DIFFERENTIAL/PLATELET
Absolute Monocytes: 496 cells/uL (ref 200–950)
Basophils Absolute: 38 cells/uL (ref 0–200)
Basophils Relative: 0.9 %
Eosinophils Absolute: 130 cells/uL (ref 15–500)
Eosinophils Relative: 3.1 %
HCT: 38.1 % — ABNORMAL LOW (ref 38.5–50.0)
Hemoglobin: 12.4 g/dL — ABNORMAL LOW (ref 13.2–17.1)
Lymphs Abs: 953 cells/uL (ref 850–3900)
MCH: 29.9 pg (ref 27.0–33.0)
MCHC: 32.5 g/dL (ref 32.0–36.0)
MCV: 91.8 fL (ref 80.0–100.0)
MPV: 13 fL — ABNORMAL HIGH (ref 7.5–12.5)
Monocytes Relative: 11.8 %
Neutro Abs: 2583 cells/uL (ref 1500–7800)
Neutrophils Relative %: 61.5 %
Platelets: 88 10*3/uL — ABNORMAL LOW (ref 140–400)
RBC: 4.15 10*6/uL — ABNORMAL LOW (ref 4.20–5.80)
RDW: 13.5 % (ref 11.0–15.0)
Total Lymphocyte: 22.7 %
WBC: 4.2 10*3/uL (ref 3.8–10.8)

## 2020-03-31 LAB — HEMOGLOBIN A1C
Hgb A1c MFr Bld: 6.6 % of total Hgb — ABNORMAL HIGH (ref <5.7)
Mean Plasma Glucose: 143 (calc)
eAG (mmol/L): 7.9 (calc)

## 2020-03-31 LAB — LIPID PANEL
Cholesterol: 92 mg/dL (ref <200)
HDL: 43 mg/dL (ref >=40)
LDL Cholesterol (Calc): 35 mg/dL (calc)
Non-HDL Cholesterol (Calc): 49 mg/dL (calc) (ref <130)
Total CHOL/HDL Ratio: 2.1 (calc) (ref <5.0)
Triglycerides: 55 mg/dL (ref <150)

## 2020-03-31 LAB — URINALYSIS, ROUTINE W REFLEX MICROSCOPIC
Bilirubin Urine: NEGATIVE
Hgb urine dipstick: NEGATIVE
Ketones, ur: NEGATIVE
Leukocytes,Ua: NEGATIVE
Nitrite: NEGATIVE
Protein, ur: NEGATIVE
Specific Gravity, Urine: 1.027 (ref 1.001–1.03)
pH: <=5 (ref 5.0–8.0)

## 2020-03-31 LAB — BRAIN NATRIURETIC PEPTIDE: Brain Natriuretic Peptide: 123 pg/mL — ABNORMAL HIGH (ref <100)

## 2020-03-31 LAB — TSH: TSH: 1.51 mIU/L (ref 0.40–4.50)

## 2020-03-31 LAB — PSA: PSA: 0.3 ng/mL (ref <=4.0)

## 2020-04-01 ENCOUNTER — Encounter: Payer: Self-pay | Admitting: Internal Medicine

## 2020-04-01 DIAGNOSIS — N400 Enlarged prostate without lower urinary tract symptoms: Secondary | ICD-10-CM | POA: Insufficient documentation

## 2020-04-01 NOTE — Assessment & Plan Note (Signed)
Ok for urology referral.  

## 2020-04-01 NOTE — Assessment & Plan Note (Signed)
Overcontrolled a1c, to d/c glipiizide, stable overall by history and exam, recent data reviewed with pt, and pt to continue medical treatment as before,  to f/u any worsening symptoms or concerns

## 2020-04-01 NOTE — Assessment & Plan Note (Signed)
stable overall by history and exam, recent data reviewed with pt, and pt to continue medical treatment as before,  to f/u any worsening symptoms or concerns  

## 2020-04-01 NOTE — Assessment & Plan Note (Signed)
Chronic stable, f/u heme sept 2021 as planned

## 2020-04-01 NOTE — Assessment & Plan Note (Signed)
For replacement and f/u lab

## 2020-04-01 NOTE — Assessment & Plan Note (Addendum)
?   Venous insufficiency vs other, for BNP, add hct 12.5 qd  I spent 31 minutes in addition to time for CPX wellness examination in preparing to see the patient by review of recent labs, imaging and procedures, obtaining and reviewing separately obtained history, communicating with the patient and family or caregiver, ordering medications, tests or procedures, and documenting clinical information in the EHR including the differential Dx, treatment, and any further evaluation and other management of peripheral edema, tcp, htn, dm, bph, b12 deficiency

## 2020-04-01 NOTE — Assessment & Plan Note (Signed)

## 2020-05-09 ENCOUNTER — Other Ambulatory Visit: Payer: Self-pay | Admitting: Hematology and Oncology

## 2020-05-09 DIAGNOSIS — D696 Thrombocytopenia, unspecified: Secondary | ICD-10-CM
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
## 2020-05-10 ENCOUNTER — Inpatient Hospital Stay: Payer: Medicare HMO | Attending: Hematology and Oncology | Admitting: Hematology and Oncology

## 2020-05-10 ENCOUNTER — Inpatient Hospital Stay: Payer: Medicare HMO

## 2020-05-10 ENCOUNTER — Encounter: Payer: Self-pay | Admitting: Hematology and Oncology

## 2020-05-10 ENCOUNTER — Other Ambulatory Visit: Payer: Self-pay

## 2020-05-10 VITALS — BP 106/53 | HR 71 | Temp 97.8°F | Resp 18 | Ht 75.0 in | Wt 230.7 lb

## 2020-05-10 DIAGNOSIS — E119 Type 2 diabetes mellitus without complications: Secondary | ICD-10-CM | POA: Diagnosis not present

## 2020-05-10 DIAGNOSIS — D693 Immune thrombocytopenic purpura: Secondary | ICD-10-CM | POA: Diagnosis not present

## 2020-05-10 DIAGNOSIS — D696 Thrombocytopenia, unspecified: Secondary | ICD-10-CM

## 2020-05-10 DIAGNOSIS — Z7984 Long term (current) use of oral hypoglycemic drugs: Secondary | ICD-10-CM | POA: Insufficient documentation

## 2020-05-10 DIAGNOSIS — R161 Splenomegaly, not elsewhere classified: Secondary | ICD-10-CM | POA: Diagnosis not present

## 2020-05-10 DIAGNOSIS — I2510 Atherosclerotic heart disease of native coronary artery without angina pectoris: Secondary | ICD-10-CM | POA: Insufficient documentation

## 2020-05-10 DIAGNOSIS — Z79899 Other long term (current) drug therapy: Secondary | ICD-10-CM | POA: Insufficient documentation

## 2020-05-10 DIAGNOSIS — I4891 Unspecified atrial fibrillation: Secondary | ICD-10-CM | POA: Insufficient documentation

## 2020-05-10 DIAGNOSIS — I10 Essential (primary) hypertension: Secondary | ICD-10-CM | POA: Diagnosis not present

## 2020-05-10 DIAGNOSIS — Z87891 Personal history of nicotine dependence: Secondary | ICD-10-CM | POA: Insufficient documentation

## 2020-05-10 LAB — CBC WITH DIFFERENTIAL (CANCER CENTER ONLY)
Abs Immature Granulocytes: 0.01 10*3/uL (ref 0.00–0.07)
Basophils Absolute: 0 10*3/uL (ref 0.0–0.1)
Basophils Relative: 1 %
Eosinophils Absolute: 0.1 10*3/uL (ref 0.0–0.5)
Eosinophils Relative: 3 %
HCT: 39 % (ref 39.0–52.0)
Hemoglobin: 12.7 g/dL — ABNORMAL LOW (ref 13.0–17.0)
Immature Granulocytes: 0 %
Lymphocytes Relative: 21 %
Lymphs Abs: 0.9 10*3/uL (ref 0.7–4.0)
MCH: 29.8 pg (ref 26.0–34.0)
MCHC: 32.6 g/dL (ref 30.0–36.0)
MCV: 91.5 fL (ref 80.0–100.0)
Monocytes Absolute: 0.5 10*3/uL (ref 0.1–1.0)
Monocytes Relative: 11 %
Neutro Abs: 2.8 10*3/uL (ref 1.7–7.7)
Neutrophils Relative %: 64 %
Platelet Count: 93 10*3/uL — ABNORMAL LOW (ref 150–400)
RBC: 4.26 MIL/uL (ref 4.22–5.81)
RDW: 14.9 % (ref 11.5–15.5)
WBC Count: 4.4 10*3/uL (ref 4.0–10.5)
nRBC: 0 % (ref 0.0–0.2)

## 2020-05-10 LAB — CMP (CANCER CENTER ONLY)
ALT: 7 U/L (ref 0–44)
AST: 13 U/L — ABNORMAL LOW (ref 15–41)
Albumin: 4.1 g/dL (ref 3.5–5.0)
Alkaline Phosphatase: 57 U/L (ref 38–126)
Anion gap: 10 (ref 5–15)
BUN: 18 mg/dL (ref 8–23)
CO2: 23 mmol/L (ref 22–32)
Calcium: 10.2 mg/dL (ref 8.9–10.3)
Chloride: 107 mmol/L (ref 98–111)
Creatinine: 0.95 mg/dL (ref 0.61–1.24)
GFR, Est AFR Am: >60 mL/min (ref >60)
GFR, Estimated: >60 mL/min (ref >60)
Glucose, Bld: 115 mg/dL — ABNORMAL HIGH (ref 70–99)
Potassium: 4.4 mmol/L (ref 3.5–5.1)
Sodium: 140 mmol/L (ref 135–145)
Total Bilirubin: 1 mg/dL (ref 0.3–1.2)
Total Protein: 7.5 g/dL (ref 6.5–8.1)

## 2020-05-11 ENCOUNTER — Telehealth: Payer: Self-pay | Admitting: Hematology and Oncology

## 2020-05-11 NOTE — Telephone Encounter (Signed)
Scheduled per los. Called and spoke with patient. Confirmed appt 

## 2020-05-12 ENCOUNTER — Other Ambulatory Visit: Payer: Self-pay | Admitting: Internal Medicine

## 2020-05-12 NOTE — Telephone Encounter (Signed)
Please refill as per office routine med refill policy (all routine meds refilled for 3 mo or monthly per pt preference up to one year from last visit, then month to month grace period for 3 mo, then further med refills will have to be denied)  

## 2020-05-20 ENCOUNTER — Other Ambulatory Visit: Payer: Self-pay | Admitting: Internal Medicine

## 2020-05-20 NOTE — Telephone Encounter (Signed)
Please refill as per office routine med refill policy (all routine meds refilled for 3 mo or monthly per pt preference up to one year from last visit, then month to month grace period for 3 mo, then further med refills will have to be denied)  
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
## 2020-06-12 ENCOUNTER — Encounter: Payer: Self-pay | Admitting: Cardiology

## 2020-06-12 ENCOUNTER — Other Ambulatory Visit: Payer: Self-pay

## 2020-06-12 ENCOUNTER — Ambulatory Visit (INDEPENDENT_AMBULATORY_CARE_PROVIDER_SITE_OTHER): Payer: Medicare HMO | Admitting: Cardiology

## 2020-06-12 VITALS — BP 116/56 | HR 68 | Ht 75.0 in | Wt 231.0 lb

## 2020-06-12 DIAGNOSIS — I10 Essential (primary) hypertension: Secondary | ICD-10-CM

## 2020-06-12 DIAGNOSIS — E7800 Pure hypercholesterolemia, unspecified: Secondary | ICD-10-CM | POA: Diagnosis not present

## 2020-06-12 DIAGNOSIS — R9389 Abnormal findings on diagnostic imaging of other specified body structures: Secondary | ICD-10-CM | POA: Diagnosis not present

## 2020-06-12 DIAGNOSIS — I2510 Atherosclerotic heart disease of native coronary artery without angina pectoris: Secondary | ICD-10-CM | POA: Diagnosis not present

## 2020-06-12 DIAGNOSIS — I4821 Permanent atrial fibrillation: Secondary | ICD-10-CM

## 2020-06-12 NOTE — Patient Instructions (Signed)
  Testing/Procedures:  ABDOMINAL ULTRASOUND COMPLETE WITH ATTN TO THE SPLEEN @ [address]= [place] [facility]   Follow-Up: At [facility], you and your health needs are our priority.  As part of our continuing mission to provide you with exceptional heart care, we have created designated Provider Care Teams.  These Care Teams include your primary Cardiologist (physician) and Advanced Practice Providers (APPs -  Physician Assistants and Nurse Practitioners) who all work together to provide you with the care you need, when you need it.  We recommend signing up for the patient portal called "MyChart".  Sign up information is provided on this After Visit Summary.  MyChart is used to connect with patients for Virtual Visits (Telemedicine).  Patients are able to view lab/test results, encounter notes, upcoming appointments, etc.  Non-urgent messages can be sent to your provider as well.   To learn more about what you can do with MyChart, go to [email or web address].    Your next appointment:   12 month(s)  The format for your next appointment:   In Person  Provider:   You may see Olga Millers MD or one of the following Advanced Practice Providers on your designated Care Team:    Corine Shelter, PA-C  [place], [place]  Edd Fabian, [place]

## 2020-06-19 ENCOUNTER — Telehealth: Payer: Self-pay | Admitting: Cardiology

## 2020-06-19 NOTE — Telephone Encounter (Signed)
Spoke with rena, order changed to US of the spleen.

## 2020-06-20 ENCOUNTER — Ambulatory Visit
Admission: RE | Admit: 2020-06-20 | Discharge: 2020-06-20 | Disposition: A | Discharge status: Home / Self Care | Payer: Medicare HMO | Source: Ambulatory Visit | Attending: Cardiology | Admitting: Cardiology

## 2020-06-20 DIAGNOSIS — R9389 Abnormal findings on diagnostic imaging of other specified body structures: Secondary | ICD-10-CM

## 2020-06-20 DIAGNOSIS — D7389 Other diseases of spleen: Secondary | ICD-10-CM | POA: Diagnosis not present

## 2020-06-29 IMAGING — US US ABDOMEN COMPLETE
1 series · 13 of 25 positions shown · non-contrast
Comparison: None.

CLINICAL DATA: Thrombocytopenia.

EXAM:
ABDOMEN ULTRASOUND COMPLETE

[Series 1: us abdomen complete · 13 of 127 slices shown]
[im 1/127]
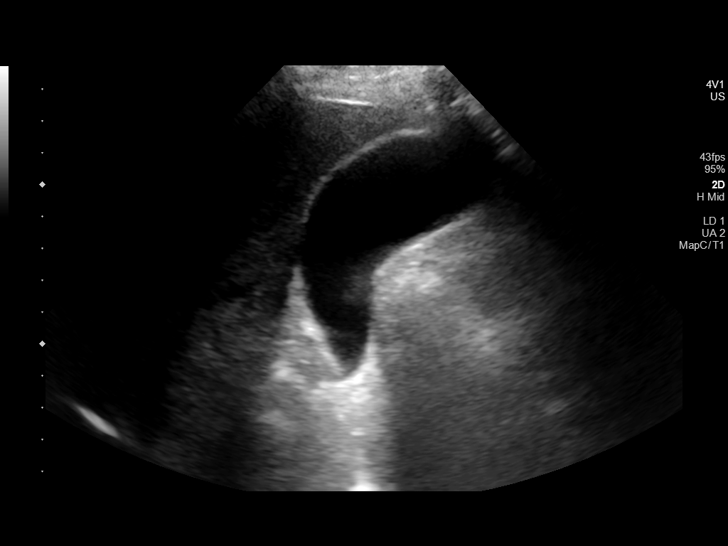
[im 11/127]
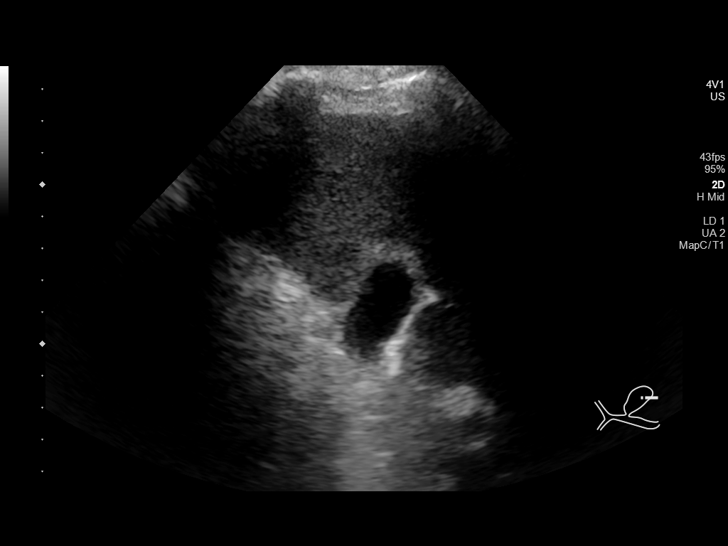
[im 22/127]
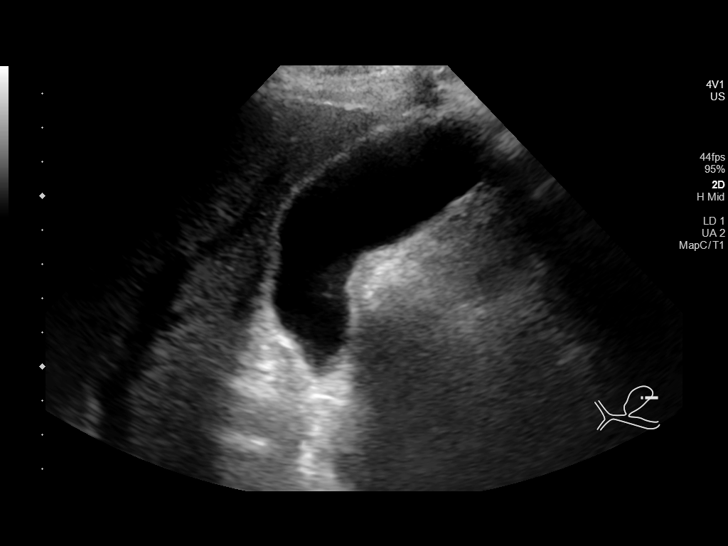
[im 32/127]
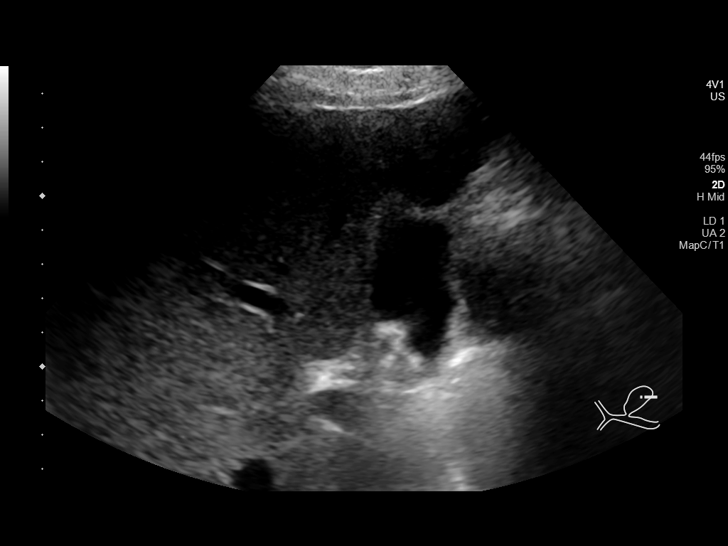
[im 43/127]
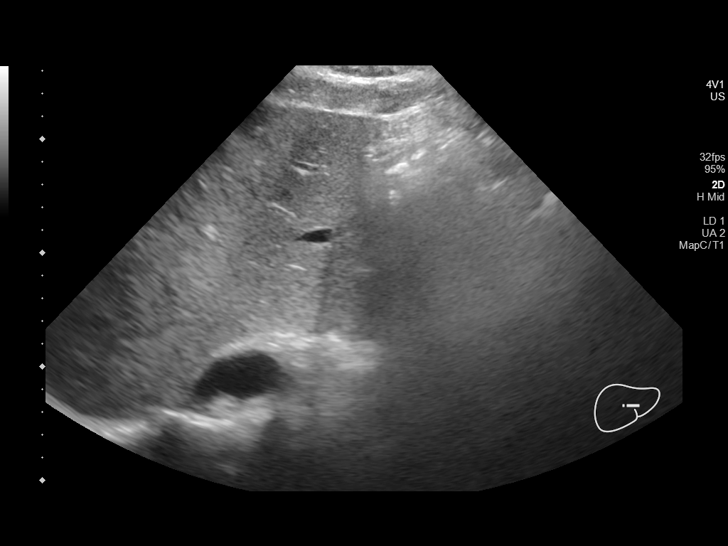
[im 53/127]
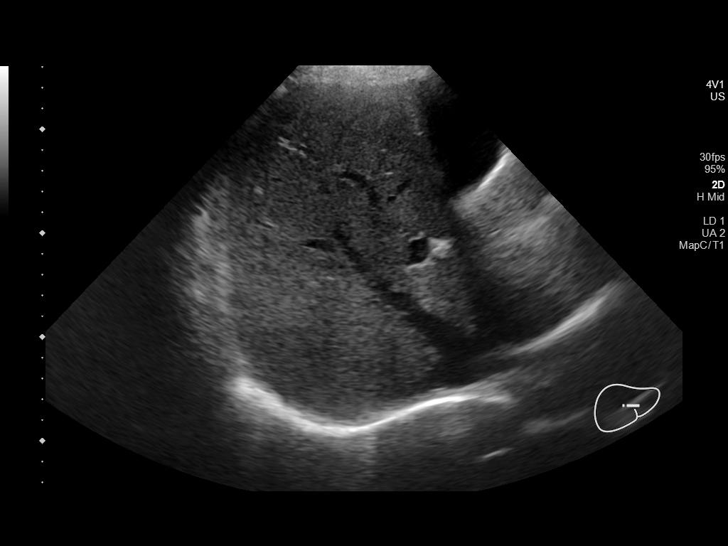
[im 64/127]
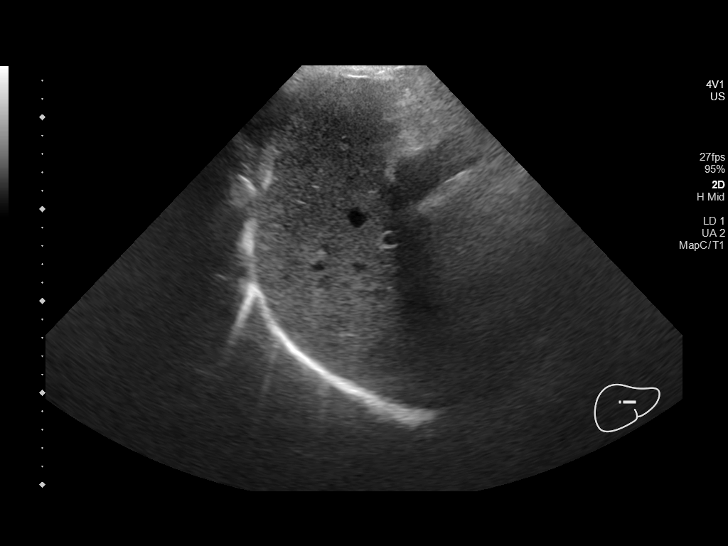
[im 74/127]
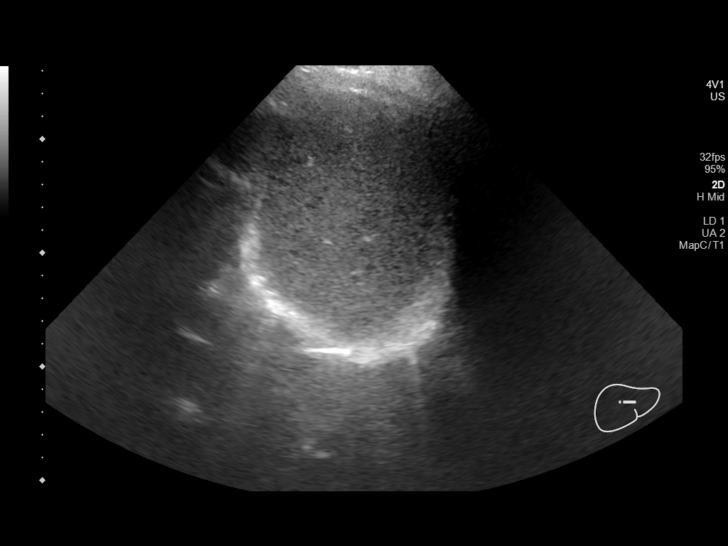
[im 85/127]
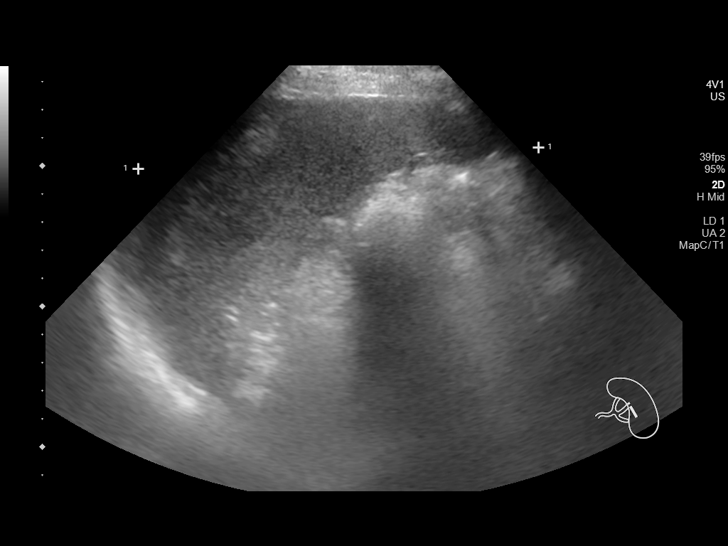
[im 95/127]
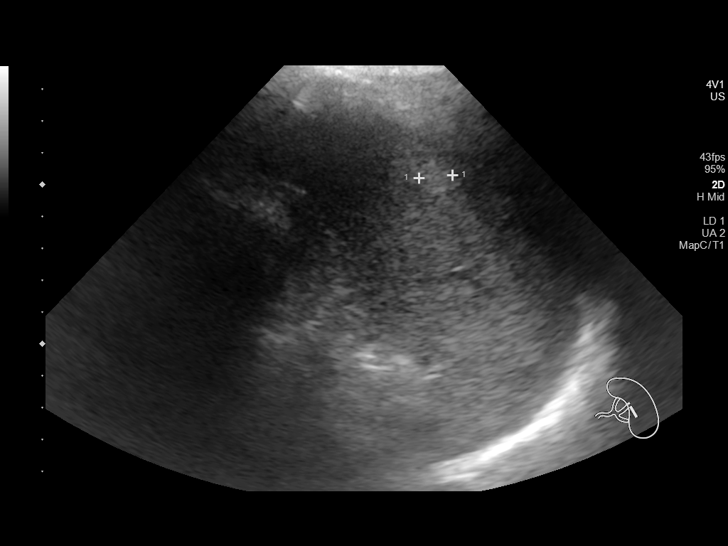
[im 106/127]
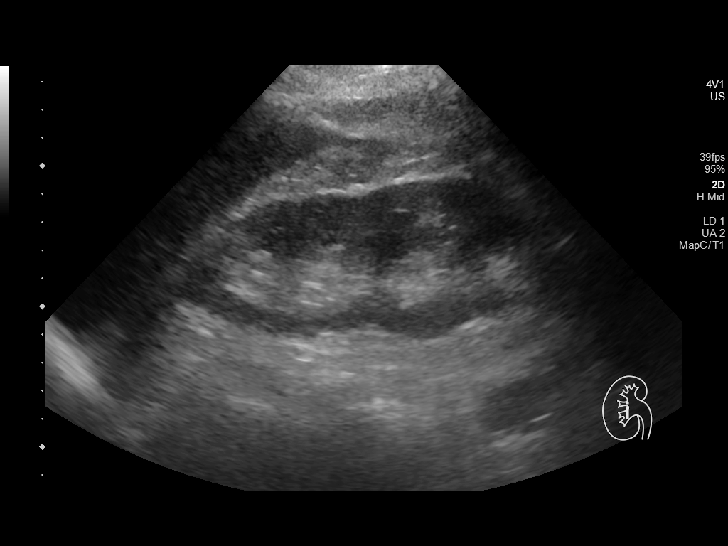
[im 116/127]
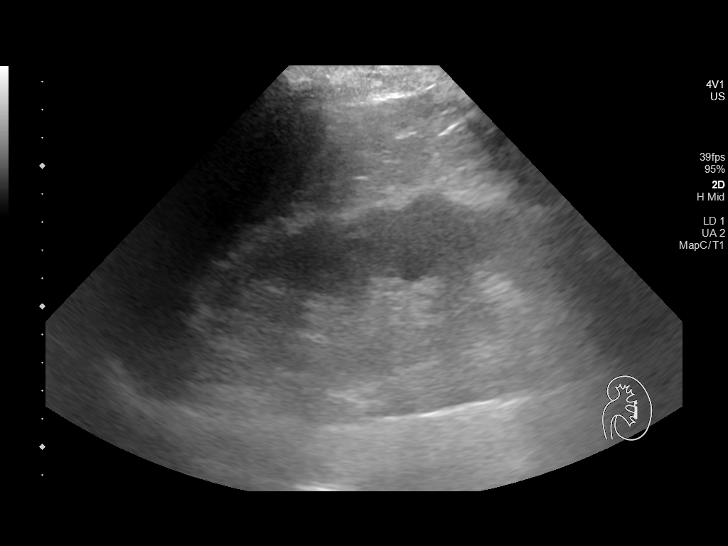
[im 127/127]
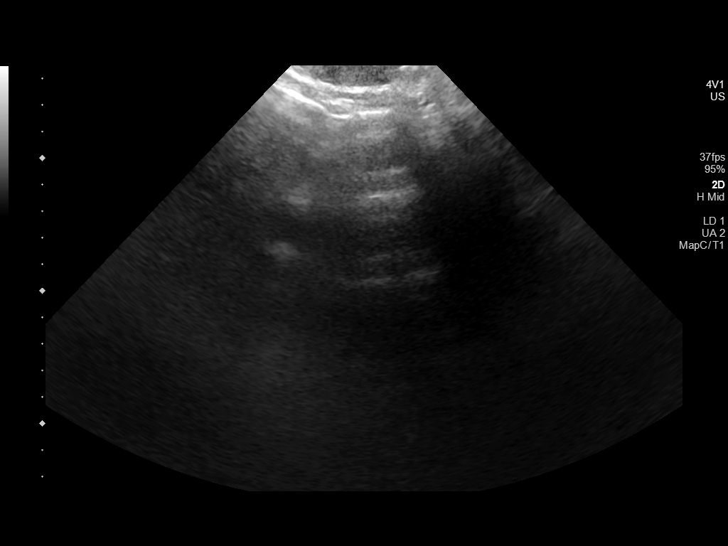

[13 of 25 positions shown; findings below may reference images not displayed]

FINDINGS: Gallbladder: No gallstones or wall thickening visualized. No
sonographic Murphy sign noted by sonographer.

Common bile duct: Diameter: 5 mm, within normal limits.

Liver: No focal lesion identified. Within normal limits in
parenchymal echogenicity. Portal vein is patent on color Doppler
imaging with normal direction of blood flow towards the liver.

IVC: No abnormality visualized.

Pancreas: Visualized portion unremarkable.

Spleen: Mild splenomegaly is seen length of approximately 14 cm, and
calculated volume of 528 mL. A 2 cm benign-appearing cyst is noted.
A 1.4 cm hyperechoic mass is also seen suspicious for a benign
hemangioma.

Right Kidney: Length: 14.0 cm. Echogenicity within normal limits. No
mass or hydronephrosis visualized.

Left Kidney: Length: 12.3 cm. Echogenicity within normal limits. No
mass or hydronephrosis visualized.

Abdominal aorta: No aneurysm visualized.

Other findings: None.
IMPRESSION: Mild splenomegaly.

Small benign appearing cystic lesion in spleen. 1.4 cm hyperechoic
mass in spleen likely represents a benign hemangioma. Recommend
follow-up by ultrasound in 6 months to confirm stability.

No hepatobiliary abnormality identified.

## 2020-08-19 ENCOUNTER — Other Ambulatory Visit: Payer: Self-pay | Admitting: Cardiology

## 2020-08-21 ENCOUNTER — Encounter: Payer: Self-pay | Admitting: *Deleted

## 2020-09-11 ENCOUNTER — Other Ambulatory Visit: Payer: Self-pay | Admitting: *Deleted

## 2020-09-11 MED ORDER — APIXABAN 5 MG PO TABS
5.0000 mg | ORAL_TABLET | Freq: Two times a day (BID) | ORAL | 3 refills | Status: DC
Start: 2020-09-11 — End: 2023-11-17

## 2020-10-03 ENCOUNTER — Other Ambulatory Visit: Payer: Self-pay

## 2020-10-03 ENCOUNTER — Encounter: Payer: Self-pay | Admitting: Internal Medicine

## 2020-10-03 ENCOUNTER — Ambulatory Visit (INDEPENDENT_AMBULATORY_CARE_PROVIDER_SITE_OTHER): Payer: Medicare HMO | Admitting: Internal Medicine

## 2020-10-03 VITALS — BP 138/76 | HR 83 | Temp 98.6°F | Ht 75.0 in | Wt 237.0 lb

## 2020-10-03 DIAGNOSIS — Z125 Encounter for screening for malignant neoplasm of prostate: Secondary | ICD-10-CM

## 2020-10-03 DIAGNOSIS — E7800 Pure hypercholesterolemia, unspecified: Secondary | ICD-10-CM | POA: Diagnosis not present

## 2020-10-03 DIAGNOSIS — M7022 Olecranon bursitis, left elbow: Secondary | ICD-10-CM

## 2020-10-03 DIAGNOSIS — D696 Thrombocytopenia, unspecified: Secondary | ICD-10-CM

## 2020-10-03 DIAGNOSIS — Z0001 Encounter for general adult medical examination with abnormal findings: Secondary | ICD-10-CM | POA: Diagnosis not present

## 2020-10-03 DIAGNOSIS — E1165 Type 2 diabetes mellitus with hyperglycemia: Secondary | ICD-10-CM | POA: Diagnosis not present

## 2020-10-03 DIAGNOSIS — E559 Vitamin D deficiency, unspecified: Secondary | ICD-10-CM

## 2020-10-03 DIAGNOSIS — E538 Deficiency of other specified B group vitamins: Secondary | ICD-10-CM | POA: Diagnosis not present

## 2020-10-03 DIAGNOSIS — I10 Essential (primary) hypertension: Secondary | ICD-10-CM | POA: Diagnosis not present

## 2020-10-03 LAB — URINALYSIS, ROUTINE W REFLEX MICROSCOPIC
Bilirubin Urine: NEGATIVE
Hgb urine dipstick: NEGATIVE
Ketones, ur: NEGATIVE
Leukocytes,Ua: NEGATIVE
Nitrite: NEGATIVE
RBC / HPF: NONE SEEN (ref 0–?)
Specific Gravity, Urine: 1.015 (ref 1.000–1.030)
Total Protein, Urine: NEGATIVE
Urine Glucose: >=1000 — AB
Urobilinogen, UA: 0.2 (ref 0.0–1.0)
pH: 6.5 (ref 5.0–8.0)

## 2020-10-03 LAB — CBC WITH DIFFERENTIAL/PLATELET
Basophils Absolute: 0 10*3/uL (ref 0.0–0.1)
Basophils Relative: 1 % (ref 0.0–3.0)
Eosinophils Absolute: 0.1 10*3/uL (ref 0.0–0.7)
Eosinophils Relative: 3 % (ref 0.0–5.0)
HCT: 38.8 % — ABNORMAL LOW (ref 39.0–52.0)
Hemoglobin: 12.9 g/dL — ABNORMAL LOW (ref 13.0–17.0)
Lymphocytes Relative: 27.4 % (ref 12.0–46.0)
Lymphs Abs: 1.1 10*3/uL (ref 0.7–4.0)
MCHC: 33.3 g/dL (ref 30.0–36.0)
MCV: 89.3 fl (ref 78.0–100.0)
Monocytes Absolute: 0.5 10*3/uL (ref 0.1–1.0)
Monocytes Relative: 12.8 % — ABNORMAL HIGH (ref 3.0–12.0)
Neutro Abs: 2.2 10*3/uL (ref 1.4–7.7)
Neutrophils Relative %: 55.8 % (ref 43.0–77.0)
Platelets: 80 10*3/uL — ABNORMAL LOW (ref 150.0–400.0)
RBC: 4.34 Mil/uL (ref 4.22–5.81)
RDW: 15.4 % (ref 11.5–15.5)
WBC: 4 10*3/uL (ref 4.0–10.5)

## 2020-10-03 LAB — LIPID PANEL
Cholesterol: 97 mg/dL (ref 0–200)
HDL: 42.2 mg/dL (ref >39.00)
LDL Cholesterol: 39 mg/dL (ref 0–99)
NonHDL: 54.44
Total CHOL/HDL Ratio: 2
Triglycerides: 75 mg/dL (ref 0.0–149.0)
VLDL: 15 mg/dL (ref 0.0–40.0)

## 2020-10-03 LAB — HEMOGLOBIN A1C: Hgb A1c MFr Bld: 7.6 % — ABNORMAL HIGH (ref 4.6–6.5)

## 2020-10-03 LAB — MICROALBUMIN / CREATININE URINE RATIO
Creatinine,U: 51 mg/dL
Microalb Creat Ratio: 6.8 mg/g (ref 0.0–30.0)
Microalb, Ur: 3.5 mg/dL — ABNORMAL HIGH (ref 0.0–1.9)

## 2020-10-03 LAB — HEPATIC FUNCTION PANEL
ALT: 14 U/L (ref 0–53)
AST: 17 U/L (ref 0–37)
Albumin: 4.8 g/dL (ref 3.5–5.2)
Alkaline Phosphatase: 74 U/L (ref 39–117)
Bilirubin, Direct: 0.3 mg/dL (ref 0.0–0.3)
Total Bilirubin: 1.1 mg/dL (ref 0.2–1.2)
Total Protein: 7.3 g/dL (ref 6.0–8.3)

## 2020-10-03 LAB — VITAMIN D 25 HYDROXY (VIT D DEFICIENCY, FRACTURES): VITD: 39.42 ng/mL (ref 30.00–100.00)

## 2020-10-03 LAB — BASIC METABOLIC PANEL
BUN: 25 mg/dL — ABNORMAL HIGH (ref 6–23)
CO2: 28 mEq/L (ref 19–32)
Calcium: 9.7 mg/dL (ref 8.4–10.5)
Chloride: 105 mEq/L (ref 96–112)
Creatinine, Ser: 1.05 mg/dL (ref 0.40–1.50)
GFR: 67.85 mL/min (ref >60.00)
Glucose, Bld: 117 mg/dL — ABNORMAL HIGH (ref 70–99)
Potassium: 5 mEq/L (ref 3.5–5.1)
Sodium: 140 mEq/L (ref 135–145)

## 2020-10-03 LAB — PSA: PSA: 0.44 ng/mL (ref 0.10–4.00)

## 2020-10-03 LAB — TSH: TSH: 2.55 u[IU]/mL (ref 0.35–4.50)

## 2020-10-03 LAB — VITAMIN B12: Vitamin B-12: 204 pg/mL — ABNORMAL LOW (ref 211–911)

## 2020-10-03 NOTE — Patient Instructions (Addendum)
To start B12 1000 mcg per day  Please avoid resting the left elbow on the chair with excercises  Please continue all other medications as before, and refills have been done if requested.  Please have the pharmacy call with any other refills you may need.  Please continue your efforts at being more active, low cholesterol diet, and weight control.  You are otherwise up to date with prevention measures today.  Please keep your appointments with your specialists as you may have planned  Please go to the LAB at the blood drawing area for the tests to be done  You will be contacted by phone if any changes need to be made immediately.  Otherwise, you will receive a letter about your results with an explanation, but please check with MyChart first.  Please remember to sign up for MyChart if you have not done so, as this will be important to you in the future with finding out test results, communicating by private email, and scheduling acute appointments online when needed.  Please make an Appointment to return in 6 months, or sooner if needed
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
## 2020-10-04 ENCOUNTER — Encounter: Payer: Self-pay | Admitting: Internal Medicine

## 2020-10-08 ENCOUNTER — Encounter: Payer: Self-pay | Admitting: Internal Medicine

## 2020-10-08 DIAGNOSIS — M7020 Olecranon bursitis, unspecified elbow: Secondary | ICD-10-CM | POA: Insufficient documentation

## 2020-10-08 NOTE — Assessment & Plan Note (Addendum)
Lab Results  Component Value Date   VITAMINB12 204 (L) 10/03/2020   Stable, start oral replacement - b12 1000 mcg qd

## 2020-10-08 NOTE — Assessment & Plan Note (Signed)
Chronic stable, cont to monitor 

## 2020-10-08 NOTE — Assessment & Plan Note (Signed)

## 2020-10-08 NOTE — Assessment & Plan Note (Signed)
Lab Results  Component Value Date   LDLCALC 39 10/03/2020   Stable, pt to continue current statin crestor 40

## 2020-10-08 NOTE — Assessment & Plan Note (Signed)
BP Readings from Last 3 Encounters:  10/03/20 138/76  06/12/20 (!) 116/56  05/10/20 (!) 106/53   Stable, pt to continue medical treatment vasotec, lopressor

## 2020-10-08 NOTE — Assessment & Plan Note (Signed)
Lab Results  Component Value Date   HGBA1C 7.6 (H) 10/03/2020   Stable, pt to continue current medical treatment jardiacne, metformiin actos  Current Outpatient Medications (Endocrine & Metabolic):  .  empagliflozin (JARDIANCE) 25 MG TABS tablet, Take 25 mg by mouth daily. .  metFORMIN (GLUCOPHAGE) 1000 MG tablet, TAKE 1 TABLET TWICE DAILY .  pioglitazone (ACTOS) 45 MG tablet, TAKE 1 TABLET EVERY DAY  Current Outpatient Medications (Cardiovascular):  .  enalapril (VASOTEC) 5 MG tablet, TAKE 1 TABLET EVERY DAY .  metoprolol tartrate (LOPRESSOR) 25 MG tablet, TAKE 1 TABLET EVERY DAY .  rosuvastatin (CRESTOR) 40 MG tablet, TAKE 1 TABLET EVERY DAY    Current Outpatient Medications (Hematological):  .  apixaban (ELIQUIS) 5 MG TABS tablet, Take 1 tablet (5 mg total) by mouth 2 (two) times daily.  Current Outpatient Medications (Other):  .  alfuzosin (UROXATRAL) 10 MG 24 hr tablet, TAKE 1 TABLET (10 MG TOTAL) BY MOUTH DAILY WITH BREAKFAST. Marland Kitchen  Blood Glucose Monitoring Suppl (TRUE METRIX METER) w/Device KIT, Use as directed daily E11.9 .  Cholecalciferol (VITAMIN D) 1000 UNITS capsule, Take 2,000 Units by mouth daily.  .  Coenzyme Q10 (CO Q 10) 100 MG CAPS, Take 100 mg by mouth daily. Marland Kitchen  glucose blood test strip, Use as instructed three times daily E11.9 .  Lancets MISC, Use as directed three times daily E11.9 .  TRUE METRIX BLOOD GLUCOSE TEST test strip, TEST BLOOD SUGAR THREE TIMES DAILY AS INSTRUCTED

## 2020-10-08 NOTE — Assessment & Plan Note (Signed)
Mild, nontender, ok to follow with conservataive management and avoid resting elbow on chair with excercises

## 2020-10-24 ENCOUNTER — Telehealth: Payer: Self-pay | Admitting: Internal Medicine

## 2020-10-24 NOTE — Telephone Encounter (Signed)
Patient dropped off medication assist forms to be filled out. I am putting them in Lindsey's office.    When they are finished please fax them to [phone]  Best contact # [phone]

## 2020-10-26 ENCOUNTER — Encounter: Payer: Self-pay | Admitting: *Deleted

## 2020-11-07 ENCOUNTER — Other Ambulatory Visit: Payer: Self-pay | Admitting: Hematology and Oncology

## 2020-11-07 DIAGNOSIS — D696 Thrombocytopenia, unspecified: Secondary | ICD-10-CM
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
## 2020-11-08 ENCOUNTER — Encounter: Payer: Self-pay | Admitting: Hematology and Oncology

## 2020-11-08 ENCOUNTER — Inpatient Hospital Stay: Payer: Medicare HMO | Attending: Hematology and Oncology | Admitting: Hematology and Oncology

## 2020-11-08 ENCOUNTER — Other Ambulatory Visit: Payer: Self-pay

## 2020-11-08 ENCOUNTER — Inpatient Hospital Stay: Payer: Medicare HMO

## 2020-11-08 VITALS — BP 127/62 | HR 82 | Temp 98.0°F | Resp 18 | Ht 75.0 in | Wt 235.7 lb

## 2020-11-08 DIAGNOSIS — E538 Deficiency of other specified B group vitamins: Secondary | ICD-10-CM | POA: Insufficient documentation

## 2020-11-08 DIAGNOSIS — E119 Type 2 diabetes mellitus without complications: Secondary | ICD-10-CM | POA: Diagnosis not present

## 2020-11-08 DIAGNOSIS — Z87891 Personal history of nicotine dependence: Secondary | ICD-10-CM | POA: Insufficient documentation

## 2020-11-08 DIAGNOSIS — I4891 Unspecified atrial fibrillation: Secondary | ICD-10-CM | POA: Insufficient documentation

## 2020-11-08 DIAGNOSIS — Z7984 Long term (current) use of oral hypoglycemic drugs: Secondary | ICD-10-CM | POA: Insufficient documentation

## 2020-11-08 DIAGNOSIS — D696 Thrombocytopenia, unspecified: Secondary | ICD-10-CM

## 2020-11-08 DIAGNOSIS — Z7901 Long term (current) use of anticoagulants: Secondary | ICD-10-CM | POA: Insufficient documentation

## 2020-11-08 DIAGNOSIS — I10 Essential (primary) hypertension: Secondary | ICD-10-CM | POA: Insufficient documentation

## 2020-11-08 DIAGNOSIS — Z79899 Other long term (current) drug therapy: Secondary | ICD-10-CM | POA: Diagnosis not present

## 2020-11-08 DIAGNOSIS — D693 Immune thrombocytopenic purpura: Secondary | ICD-10-CM | POA: Insufficient documentation

## 2020-11-08 LAB — CMP (CANCER CENTER ONLY)
ALT: 14 U/L (ref 0–44)
AST: 19 U/L (ref 15–41)
Albumin: 4.3 g/dL (ref 3.5–5.0)
Alkaline Phosphatase: 64 U/L (ref 38–126)
Anion gap: 9 (ref 5–15)
BUN: 26 mg/dL — ABNORMAL HIGH (ref 8–23)
CO2: 24 mmol/L (ref 22–32)
Calcium: 9.3 mg/dL (ref 8.9–10.3)
Chloride: 108 mmol/L (ref 98–111)
Creatinine: 1.03 mg/dL (ref 0.61–1.24)
GFR, Estimated: >60 mL/min (ref >60)
Glucose, Bld: 122 mg/dL — ABNORMAL HIGH (ref 70–99)
Potassium: 4.4 mmol/L (ref 3.5–5.1)
Sodium: 141 mmol/L (ref 135–145)
Total Bilirubin: 1 mg/dL (ref 0.3–1.2)
Total Protein: 7.1 g/dL (ref 6.5–8.1)

## 2020-11-08 LAB — CBC WITH DIFFERENTIAL (CANCER CENTER ONLY)
Abs Immature Granulocytes: 0.01 10*3/uL (ref 0.00–0.07)
Basophils Absolute: 0 10*3/uL (ref 0.0–0.1)
Basophils Relative: 1 %
Eosinophils Absolute: 0.1 10*3/uL (ref 0.0–0.5)
Eosinophils Relative: 4 %
HCT: 36.9 % — ABNORMAL LOW (ref 39.0–52.0)
Hemoglobin: 11.9 g/dL — ABNORMAL LOW (ref 13.0–17.0)
Immature Granulocytes: 0 %
Lymphocytes Relative: 25 %
Lymphs Abs: 0.9 10*3/uL (ref 0.7–4.0)
MCH: 29.4 pg (ref 26.0–34.0)
MCHC: 32.2 g/dL (ref 30.0–36.0)
MCV: 91.1 fL (ref 80.0–100.0)
Monocytes Absolute: 0.4 10*3/uL (ref 0.1–1.0)
Monocytes Relative: 12 %
Neutro Abs: 2 10*3/uL (ref 1.7–7.7)
Neutrophils Relative %: 58 %
Platelet Count: 76 10*3/uL — ABNORMAL LOW (ref 150–400)
RBC: 4.05 MIL/uL — ABNORMAL LOW (ref 4.22–5.81)
RDW: 14.9 % (ref 11.5–15.5)
WBC Count: 3.5 10*3/uL — ABNORMAL LOW (ref 4.0–10.5)
nRBC: 0 % (ref 0.0–0.2)

## 2020-11-09 ENCOUNTER — Telehealth: Payer: Self-pay | Admitting: Hematology and Oncology

## 2020-11-09 NOTE — Telephone Encounter (Signed)
Scheduled per los. Called and spoke with patient. Confirmed appt 

## 2020-11-24 ENCOUNTER — Other Ambulatory Visit: Payer: Self-pay | Admitting: Internal Medicine

## 2020-11-24 NOTE — Telephone Encounter (Signed)
Please refill as per office routine med refill policy (all routine meds refilled for 3 mo or monthly per pt preference up to one year from last visit, then month to month grace period for 3 mo, then further med refills will have to be denied)  

## 2020-12-20 DIAGNOSIS — H2513 Age-related nuclear cataract, bilateral: Secondary | ICD-10-CM | POA: Diagnosis not present

## 2020-12-20 DIAGNOSIS — H53002 Unspecified amblyopia, left eye: Secondary | ICD-10-CM | POA: Insufficient documentation

## 2020-12-20 DIAGNOSIS — Z7984 Long term (current) use of oral hypoglycemic drugs: Secondary | ICD-10-CM | POA: Diagnosis not present

## 2020-12-20 DIAGNOSIS — E119 Type 2 diabetes mellitus without complications: Secondary | ICD-10-CM | POA: Diagnosis not present

## 2021-01-03 ENCOUNTER — Inpatient Hospital Stay: Payer: Medicare HMO | Attending: Hematology and Oncology

## 2021-01-03 ENCOUNTER — Other Ambulatory Visit: Payer: Self-pay

## 2021-01-03 DIAGNOSIS — D693 Immune thrombocytopenic purpura: Secondary | ICD-10-CM | POA: Insufficient documentation

## 2021-01-03 DIAGNOSIS — D696 Thrombocytopenia, unspecified: Secondary | ICD-10-CM

## 2021-01-03 LAB — CBC WITH DIFFERENTIAL (CANCER CENTER ONLY)
Abs Immature Granulocytes: 0.02 10*3/uL (ref 0.00–0.07)
Basophils Absolute: 0 10*3/uL (ref 0.0–0.1)
Basophils Relative: 1 %
Eosinophils Absolute: 0.3 10*3/uL (ref 0.0–0.5)
Eosinophils Relative: 5 %
HCT: 40.5 % (ref 39.0–52.0)
Hemoglobin: 13.1 g/dL (ref 13.0–17.0)
Immature Granulocytes: 0 %
Lymphocytes Relative: 21 %
Lymphs Abs: 1.1 10*3/uL (ref 0.7–4.0)
MCH: 29.5 pg (ref 26.0–34.0)
MCHC: 32.3 g/dL (ref 30.0–36.0)
MCV: 91.2 fL (ref 80.0–100.0)
Monocytes Absolute: 0.5 10*3/uL (ref 0.1–1.0)
Monocytes Relative: 9 %
Neutro Abs: 3.5 10*3/uL (ref 1.7–7.7)
Neutrophils Relative %: 64 %
Platelet Count: 134 10*3/uL — ABNORMAL LOW (ref 150–400)
RBC: 4.44 MIL/uL (ref 4.22–5.81)
RDW: 14.3 % (ref 11.5–15.5)
WBC Count: 5.3 10*3/uL (ref 4.0–10.5)
nRBC: 0 % (ref 0.0–0.2)

## 2021-01-03 LAB — VITAMIN B12: Vitamin B-12: 591 pg/mL (ref 180–914)

## 2021-01-12 ENCOUNTER — Telehealth: Payer: Self-pay | Admitting: *Deleted

## 2021-01-12 NOTE — Telephone Encounter (Signed)
-----   Message from Jaci Standard, MD sent at 01/11/2021  5:11 PM EDT ----- Please let Mr. Bonenberger know that his platelets look considerably better than during his prior visits. They have increase to >130.  We will plan to see him back as scheduled in Sept 2022.   ----- Message ----- From: Leory Plowman, Lab In [place] Sent: 01/03/2021   8:34 AM EDT To: Jaci Standard, MD

## 2021-01-12 NOTE — Telephone Encounter (Signed)
TCT patient regarding lab results. Spoke with pt and informed him that his platelets have come up nicely.  They are @ 136. Much better than the previous 76K.  Pt voiced understanding. He is aware of his next appt on 05/10/21 @ 9:30 am. No questions or concerns

## 2021-03-05 ENCOUNTER — Telehealth: Payer: Self-pay | Admitting: Internal Medicine

## 2021-03-05 MED ORDER — ENALAPRIL MALEATE 5 MG PO TABS
5.0000 mg | ORAL_TABLET | Freq: Every day | ORAL | 0 refills | Status: DC
Start: 2021-03-05 — End: 2021-06-05

## 2021-03-05 MED ORDER — ALFUZOSIN HCL ER 10 MG PO TB24
10.0000 mg | ORAL_TABLET | Freq: Every day | ORAL | 0 refills | Status: DC
Start: 2021-03-05 — End: 2021-04-03

## 2021-03-05 MED ORDER — METFORMIN HCL 1000 MG PO TABS
1000.0000 mg | ORAL_TABLET | Freq: Two times a day (BID) | ORAL | 0 refills | Status: DC
Start: 2021-03-05 — End: 2021-04-03

## 2021-03-05 MED ORDER — ROSUVASTATIN CALCIUM 40 MG PO TABS
40.0000 mg | ORAL_TABLET | Freq: Every day | ORAL | 0 refills | Status: DC
Start: 2021-03-05 — End: 2021-06-05

## 2021-03-05 NOTE — Telephone Encounter (Signed)
1.Medication Requested: alfuzosin (UROXATRAL) 10 MG 24 hr tablet  metFORMIN (GLUCOPHAGE) 1000 MG tablet  rosuvastatin (CRESTOR) 40 MG tablet  enalapril (VASOTEC) 5 MG tablet     2. Pharmacy (Name, Street, [place]): [facility] - [place], [place] - [address]  3. On Med List: yes   4. Last Visit with PCP: 10-03-20  5. Next visit date with PCP: 04-03-21   Patient states he might be missing a few medications and said that [facility] knows what he needs refilled. Patient was wondering if a call could be made to [facility].     Agent: Please be advised that RX refills may take up to 3 business days. We ask that you follow-up with your pharmacy.

## 2021-03-06 ENCOUNTER — Other Ambulatory Visit: Payer: Self-pay | Admitting: Internal Medicine

## 2021-04-03 ENCOUNTER — Ambulatory Visit (INDEPENDENT_AMBULATORY_CARE_PROVIDER_SITE_OTHER): Payer: Medicare HMO | Admitting: Internal Medicine

## 2021-04-03 ENCOUNTER — Encounter: Payer: Self-pay | Admitting: Internal Medicine

## 2021-04-03 ENCOUNTER — Telehealth: Payer: Self-pay | Admitting: Internal Medicine

## 2021-04-03 ENCOUNTER — Other Ambulatory Visit: Payer: Self-pay

## 2021-04-03 VITALS — BP 122/80 | HR 55 | Temp 98.8°F | Ht 75.0 in | Wt 219.0 lb

## 2021-04-03 DIAGNOSIS — D696 Thrombocytopenia, unspecified: Secondary | ICD-10-CM | POA: Diagnosis not present

## 2021-04-03 DIAGNOSIS — E1165 Type 2 diabetes mellitus with hyperglycemia: Secondary | ICD-10-CM

## 2021-04-03 DIAGNOSIS — I10 Essential (primary) hypertension: Secondary | ICD-10-CM | POA: Diagnosis not present

## 2021-04-03 DIAGNOSIS — E538 Deficiency of other specified B group vitamins: Secondary | ICD-10-CM | POA: Diagnosis not present

## 2021-04-03 DIAGNOSIS — E7800 Pure hypercholesterolemia, unspecified: Secondary | ICD-10-CM

## 2021-04-03 LAB — CBC WITH DIFFERENTIAL/PLATELET
Basophils Absolute: 0.1 10*3/uL (ref 0.0–0.1)
Basophils Relative: 1.5 % (ref 0.0–3.0)
Eosinophils Absolute: 0.1 10*3/uL (ref 0.0–0.7)
Eosinophils Relative: 3.1 % (ref 0.0–5.0)
HCT: 42.7 % (ref 39.0–52.0)
Hemoglobin: 14.2 g/dL (ref 13.0–17.0)
Lymphocytes Relative: 24.9 % (ref 12.0–46.0)
Lymphs Abs: 1.1 10*3/uL (ref 0.7–4.0)
MCHC: 33.2 g/dL (ref 30.0–36.0)
MCV: 90.7 fl (ref 78.0–100.0)
Monocytes Absolute: 0.5 10*3/uL (ref 0.1–1.0)
Monocytes Relative: 11.4 % (ref 3.0–12.0)
Neutro Abs: 2.5 10*3/uL (ref 1.4–7.7)
Neutrophils Relative %: 59.1 % (ref 43.0–77.0)
Platelets: 113 10*3/uL — ABNORMAL LOW (ref 150.0–400.0)
RBC: 4.71 Mil/uL (ref 4.22–5.81)
RDW: 15.7 % — ABNORMAL HIGH (ref 11.5–15.5)
WBC: 4.3 10*3/uL (ref 4.0–10.5)

## 2021-04-03 LAB — LIPID PANEL
Cholesterol: 108 mg/dL (ref 0–200)
HDL: 46 mg/dL (ref >39.00)
LDL Cholesterol: 47 mg/dL (ref 0–99)
NonHDL: 62.31
Total CHOL/HDL Ratio: 2
Triglycerides: 75 mg/dL (ref 0.0–149.0)
VLDL: 15 mg/dL (ref 0.0–40.0)

## 2021-04-03 LAB — HEPATIC FUNCTION PANEL
ALT: 14 U/L (ref 0–53)
AST: 20 U/L (ref 0–37)
Albumin: 4.7 g/dL (ref 3.5–5.2)
Alkaline Phosphatase: 55 U/L (ref 39–117)
Bilirubin, Direct: 0.2 mg/dL (ref 0.0–0.3)
Total Bilirubin: 0.9 mg/dL (ref 0.2–1.2)
Total Protein: 7.7 g/dL (ref 6.0–8.3)

## 2021-04-03 LAB — HEMOGLOBIN A1C: Hgb A1c MFr Bld: 7.5 % — ABNORMAL HIGH (ref 4.6–6.5)

## 2021-04-03 LAB — BASIC METABOLIC PANEL
BUN: 23 mg/dL (ref 6–23)
CO2: 25 mEq/L (ref 19–32)
Calcium: 9.4 mg/dL (ref 8.4–10.5)
Chloride: 107 mEq/L (ref 96–112)
Creatinine, Ser: 0.96 mg/dL (ref 0.40–1.50)
GFR: 75.29 mL/min (ref >60.00)
Glucose, Bld: 164 mg/dL — ABNORMAL HIGH (ref 70–99)
Potassium: 4.3 mEq/L (ref 3.5–5.1)
Sodium: 140 mEq/L (ref 135–145)

## 2021-04-03 LAB — VITAMIN B12: Vitamin B-12: 680 pg/mL (ref 211–911)

## 2021-04-03 MED ORDER — PIOGLITAZONE HCL 45 MG PO TABS
45.0000 mg | ORAL_TABLET | Freq: Every day | ORAL | 3 refills | Status: DC
Start: 2021-04-03 — End: 2021-12-20

## 2021-04-03 MED ORDER — TRUE METRIX METER W/DEVICE KIT
PACK | 0 refills | Status: DC
Start: 2021-04-03 — End: 2022-07-11

## 2021-04-03 MED ORDER — TRUE METRIX BLOOD GLUCOSE TEST VI STRP
ORAL_STRIP | 3 refills | Status: DC
Start: 2021-04-03 — End: 2022-04-09

## 2021-04-03 NOTE — Patient Instructions (Signed)
Ok to hold on the metformin for now  Please continue all other medications as before, and refills have been done if requested.  Please have the pharmacy call with any other refills you may need.  Please continue your efforts at being more active, low cholesterol diet, and weight control.  Please keep your appointments with your specialists as you may have planned  Please go to the LAB at the blood drawing area for the tests to be done  You will be contacted by phone if any changes need to be made immediately.  Otherwise, you will receive a letter about your results with an explanation, but please check with MyChart first.  Please remember to sign up for MyChart if you have not done so, as this will be important to you in the future with finding out test results, communicating by private email, and scheduling acute appointments online when needed.  Please make an Appointment to return in 6 months, or sooner if needed

## 2021-04-03 NOTE — Telephone Encounter (Signed)
Please call patient with lab results, and copy be mailed
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
## 2021-04-04 ENCOUNTER — Encounter: Payer: Self-pay | Admitting: Internal Medicine

## 2021-04-04 NOTE — Telephone Encounter (Signed)
Pt given A1c result & PCPs result note.  Pt verb understanding & states that he will resume the Metformin as prescribed.  Lab results/result note mailed to home address on file.

## 2021-04-05 ENCOUNTER — Encounter: Payer: Self-pay | Admitting: Internal Medicine

## 2021-04-05 NOTE — Assessment & Plan Note (Signed)
Asympt, chronic persistent, for f/u today

## 2021-04-05 NOTE — Assessment & Plan Note (Signed)
Lab Results  Component Value Date   VITAMINB12 680 04/03/2021   Stable, cont oral replacement - b12 1000 mcg qd

## 2021-04-05 NOTE — Assessment & Plan Note (Signed)
Lab Results  Component Value Date   HGBA1C 7.5 (H) 04/03/2021   uncontrolled, pt to continue current actos and jardiacne as he seems determined that metformin will hurt his kidneys though I try to reassure him this is not the case

## 2021-04-05 NOTE — Assessment & Plan Note (Signed)
BP Readings from Last 3 Encounters:  04/03/21 122/80  11/08/20 127/62  10/03/20 138/76   Stable, pt to continue medical treatment lopressor, ACE

## 2021-04-05 NOTE — Assessment & Plan Note (Signed)
Lab Results  Component Value Date   LDLCALC 47 04/03/2021   Stable, pt to continue current statin crestor 40

## 2021-05-10 ENCOUNTER — Inpatient Hospital Stay: Payer: Medicare HMO | Attending: Hematology and Oncology | Admitting: Hematology and Oncology

## 2021-05-10 ENCOUNTER — Inpatient Hospital Stay: Payer: Medicare HMO

## 2021-05-10 ENCOUNTER — Other Ambulatory Visit: Payer: Self-pay | Admitting: Hematology and Oncology

## 2021-05-10 ENCOUNTER — Other Ambulatory Visit: Payer: Self-pay

## 2021-05-10 VITALS — BP 110/60 | HR 58 | Temp 97.7°F | Resp 18 | Ht 75.0 in | Wt 219.8 lb

## 2021-05-10 DIAGNOSIS — Z7901 Long term (current) use of anticoagulants: Secondary | ICD-10-CM | POA: Diagnosis not present

## 2021-05-10 DIAGNOSIS — D696 Thrombocytopenia, unspecified: Secondary | ICD-10-CM

## 2021-05-10 DIAGNOSIS — R161 Splenomegaly, not elsewhere classified: Secondary | ICD-10-CM | POA: Diagnosis not present

## 2021-05-10 DIAGNOSIS — D693 Immune thrombocytopenic purpura: Secondary | ICD-10-CM | POA: Insufficient documentation

## 2021-05-10 DIAGNOSIS — E119 Type 2 diabetes mellitus without complications: Secondary | ICD-10-CM | POA: Insufficient documentation

## 2021-05-10 DIAGNOSIS — I4891 Unspecified atrial fibrillation: Secondary | ICD-10-CM | POA: Diagnosis not present

## 2021-05-10 DIAGNOSIS — E538 Deficiency of other specified B group vitamins: Secondary | ICD-10-CM | POA: Diagnosis not present

## 2021-05-10 LAB — CBC WITH DIFFERENTIAL (CANCER CENTER ONLY)
Abs Immature Granulocytes: 0.01 10*3/uL (ref 0.00–0.07)
Basophils Absolute: 0.1 10*3/uL (ref 0.0–0.1)
Basophils Relative: 1 %
Eosinophils Absolute: 0.1 10*3/uL (ref 0.0–0.5)
Eosinophils Relative: 3 %
HCT: 43.7 % (ref 39.0–52.0)
Hemoglobin: 14.3 g/dL (ref 13.0–17.0)
Immature Granulocytes: 0 %
Lymphocytes Relative: 23 %
Lymphs Abs: 1 10*3/uL (ref 0.7–4.0)
MCH: 30.1 pg (ref 26.0–34.0)
MCHC: 32.7 g/dL (ref 30.0–36.0)
MCV: 92 fL (ref 80.0–100.0)
Monocytes Absolute: 0.5 10*3/uL (ref 0.1–1.0)
Monocytes Relative: 11 %
Neutro Abs: 2.7 10*3/uL (ref 1.7–7.7)
Neutrophils Relative %: 62 %
Platelet Count: 107 10*3/uL — ABNORMAL LOW (ref 150–400)
RBC: 4.75 MIL/uL (ref 4.22–5.81)
RDW: 14.4 % (ref 11.5–15.5)
WBC Count: 4.4 10*3/uL (ref 4.0–10.5)
nRBC: 0 % (ref 0.0–0.2)

## 2021-05-10 LAB — LACTATE DEHYDROGENASE: LDH: 175 U/L (ref 98–192)

## 2021-05-10 LAB — CMP (CANCER CENTER ONLY)
ALT: 14 U/L (ref 0–44)
AST: 20 U/L (ref 15–41)
Albumin: 4.5 g/dL (ref 3.5–5.0)
Alkaline Phosphatase: 58 U/L (ref 38–126)
Anion gap: 11 (ref 5–15)
BUN: 28 mg/dL — ABNORMAL HIGH (ref 8–23)
CO2: 24 mmol/L (ref 22–32)
Calcium: 10 mg/dL (ref 8.9–10.3)
Chloride: 106 mmol/L (ref 98–111)
Creatinine: 1.13 mg/dL (ref 0.61–1.24)
GFR, Estimated: >60 mL/min (ref >60)
Glucose, Bld: 161 mg/dL — ABNORMAL HIGH (ref 70–99)
Potassium: 5 mmol/L (ref 3.5–5.1)
Sodium: 141 mmol/L (ref 135–145)
Total Bilirubin: 1.3 mg/dL — ABNORMAL HIGH (ref 0.3–1.2)
Total Protein: 7.7 g/dL (ref 6.5–8.1)

## 2021-05-10 LAB — VITAMIN B12: Vitamin B-12: 586 pg/mL (ref 180–914)

## 2021-05-10 LAB — FOLATE: Folate: 9.2 ng/mL (ref >5.9)

## 2021-05-10 NOTE — Telephone Encounter (Signed)
Please mail copy of AVS A and labs to patient
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
## 2021-05-11 NOTE — Telephone Encounter (Signed)
Printed and mailed a copy of labs and AVS.

## 2021-06-05 ENCOUNTER — Other Ambulatory Visit: Payer: Self-pay | Admitting: Internal Medicine

## 2021-06-05 NOTE — Telephone Encounter (Signed)
Please refill as per office routine med refill policy (all routine meds to be refilled for 3 mo or monthly (per pt preference) up to one year from last visit, then month to month grace period for 3 mo, then further med refills will have to be denied) ? ?
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
## 2021-07-03 ENCOUNTER — Ambulatory Visit: Payer: Medicare HMO | Admitting: Cardiology

## 2021-07-03 ENCOUNTER — Encounter: Payer: Self-pay | Admitting: Cardiology

## 2021-07-03 ENCOUNTER — Other Ambulatory Visit: Payer: Self-pay

## 2021-07-03 VITALS — BP 100/65 | HR 50 | Ht 75.0 in | Wt 221.0 lb

## 2021-07-03 DIAGNOSIS — I10 Essential (primary) hypertension: Secondary | ICD-10-CM | POA: Diagnosis not present

## 2021-07-03 DIAGNOSIS — E7800 Pure hypercholesterolemia, unspecified: Secondary | ICD-10-CM

## 2021-07-03 DIAGNOSIS — I2510 Atherosclerotic heart disease of native coronary artery without angina pectoris: Secondary | ICD-10-CM

## 2021-07-03 DIAGNOSIS — I4821 Permanent atrial fibrillation: Secondary | ICD-10-CM

## 2021-07-03 NOTE — Patient Instructions (Signed)
Medication Instructions:   STOP METOPROLOL  *If you need a refill on your cardiac medications before your next appointment, please call your pharmacy*    Follow-Up: At [facility], you and your health needs are our priority.  As part of our continuing mission to provide you with exceptional heart care, we have created designated Provider Care Teams.  These Care Teams include your primary Cardiologist (physician) and Advanced Practice Providers (APPs -  Physician Assistants and Nurse Practitioners) who all work together to provide you with the care you need, when you need it.  We recommend signing up for the patient portal called "MyChart".  Sign up information is provided on this After Visit Summary.  MyChart is used to connect with patients for Virtual Visits (Telemedicine).  Patients are able to view lab/test results, encounter notes, upcoming appointments, etc.  Non-urgent messages can be sent to your provider as well.   To learn more about what you can do with MyChart, go to [email or web address].    Your next appointment:   12 month(s)  The format for your next appointment:   In Person  Provider:   Brian Crenshaw, MD    

## 2021-08-16 DIAGNOSIS — E118 Type 2 diabetes mellitus with unspecified complications: Secondary | ICD-10-CM | POA: Diagnosis not present

## 2021-09-21 DIAGNOSIS — E118 Type 2 diabetes mellitus with unspecified complications: Secondary | ICD-10-CM | POA: Diagnosis not present

## 2021-09-26 ENCOUNTER — Other Ambulatory Visit: Payer: Self-pay | Admitting: Internal Medicine

## 2021-09-26 ENCOUNTER — Other Ambulatory Visit: Payer: Self-pay | Admitting: Cardiology

## 2021-10-05 ENCOUNTER — Other Ambulatory Visit: Payer: Self-pay | Admitting: Internal Medicine

## 2021-10-05 ENCOUNTER — Encounter: Payer: Self-pay | Admitting: Internal Medicine

## 2021-10-05 ENCOUNTER — Other Ambulatory Visit: Payer: Self-pay

## 2021-10-05 ENCOUNTER — Ambulatory Visit (INDEPENDENT_AMBULATORY_CARE_PROVIDER_SITE_OTHER): Payer: Medicare HMO | Admitting: Internal Medicine

## 2021-10-05 VITALS — BP 108/60 | HR 68 | Temp 98.1°F | Ht 75.0 in | Wt 225.0 lb

## 2021-10-05 DIAGNOSIS — E538 Deficiency of other specified B group vitamins: Secondary | ICD-10-CM

## 2021-10-05 DIAGNOSIS — Z7901 Long term (current) use of anticoagulants: Secondary | ICD-10-CM

## 2021-10-05 DIAGNOSIS — I10 Essential (primary) hypertension: Secondary | ICD-10-CM

## 2021-10-05 DIAGNOSIS — I4891 Unspecified atrial fibrillation: Secondary | ICD-10-CM

## 2021-10-05 DIAGNOSIS — E7800 Pure hypercholesterolemia, unspecified: Secondary | ICD-10-CM

## 2021-10-05 DIAGNOSIS — Z0001 Encounter for general adult medical examination with abnormal findings: Secondary | ICD-10-CM | POA: Diagnosis not present

## 2021-10-05 DIAGNOSIS — D696 Thrombocytopenia, unspecified: Secondary | ICD-10-CM | POA: Diagnosis not present

## 2021-10-05 DIAGNOSIS — E1165 Type 2 diabetes mellitus with hyperglycemia: Secondary | ICD-10-CM | POA: Diagnosis not present

## 2021-10-05 DIAGNOSIS — E559 Vitamin D deficiency, unspecified: Secondary | ICD-10-CM | POA: Diagnosis not present

## 2021-10-05 LAB — BASIC METABOLIC PANEL
BUN: 31 mg/dL — ABNORMAL HIGH (ref 6–23)
CO2: 28 mEq/L (ref 19–32)
Calcium: 9.6 mg/dL (ref 8.4–10.5)
Chloride: 105 mEq/L (ref 96–112)
Creatinine, Ser: 1.02 mg/dL (ref 0.40–1.50)
GFR: 69.76 mL/min (ref >60.00)
Glucose, Bld: 169 mg/dL — ABNORMAL HIGH (ref 70–99)
Potassium: 4.7 mEq/L (ref 3.5–5.1)
Sodium: 143 mEq/L (ref 135–145)

## 2021-10-05 LAB — MICROALBUMIN / CREATININE URINE RATIO
Creatinine,U: 65.5 mg/dL
Microalb Creat Ratio: 1.2 mg/g (ref 0.0–30.0)
Microalb, Ur: 0.8 mg/dL (ref 0.0–1.9)

## 2021-10-05 LAB — VITAMIN B12: Vitamin B-12: 579 pg/mL (ref 211–911)

## 2021-10-05 LAB — CBC WITH DIFFERENTIAL/PLATELET
Basophils Absolute: 0 10*3/uL (ref 0.0–0.1)
Basophils Relative: 0.8 % (ref 0.0–3.0)
Eosinophils Absolute: 0.2 10*3/uL (ref 0.0–0.7)
Eosinophils Relative: 3.7 % (ref 0.0–5.0)
HCT: 40.2 % (ref 39.0–52.0)
Hemoglobin: 13.5 g/dL (ref 13.0–17.0)
Lymphocytes Relative: 20.5 % (ref 12.0–46.0)
Lymphs Abs: 1.1 10*3/uL (ref 0.7–4.0)
MCHC: 33.7 g/dL (ref 30.0–36.0)
MCV: 90.7 fl (ref 78.0–100.0)
Monocytes Absolute: 0.6 10*3/uL (ref 0.1–1.0)
Monocytes Relative: 11.2 % (ref 3.0–12.0)
Neutro Abs: 3.3 10*3/uL (ref 1.4–7.7)
Neutrophils Relative %: 63.8 % (ref 43.0–77.0)
Platelets: 101 10*3/uL — ABNORMAL LOW (ref 150.0–400.0)
RBC: 4.43 Mil/uL (ref 4.22–5.81)
RDW: 13.9 % (ref 11.5–15.5)
WBC: 5.2 10*3/uL (ref 4.0–10.5)

## 2021-10-05 LAB — HEPATIC FUNCTION PANEL
ALT: 13 U/L (ref 0–53)
AST: 19 U/L (ref 0–37)
Albumin: 4.4 g/dL (ref 3.5–5.2)
Alkaline Phosphatase: 47 U/L (ref 39–117)
Bilirubin, Direct: 0.2 mg/dL (ref 0.0–0.3)
Total Bilirubin: 0.8 mg/dL (ref 0.2–1.2)
Total Protein: 7 g/dL (ref 6.0–8.3)

## 2021-10-05 LAB — PSA: PSA: 0.49 ng/mL (ref 0.10–4.00)

## 2021-10-05 LAB — URINALYSIS, ROUTINE W REFLEX MICROSCOPIC
Bilirubin Urine: NEGATIVE
Hgb urine dipstick: NEGATIVE
Ketones, ur: NEGATIVE
Leukocytes,Ua: NEGATIVE
Nitrite: NEGATIVE
RBC / HPF: NONE SEEN (ref 0–?)
Specific Gravity, Urine: 1.015 (ref 1.000–1.030)
Total Protein, Urine: NEGATIVE
Urine Glucose: >=1000 — AB
Urobilinogen, UA: 0.2 (ref 0.0–1.0)
pH: 5.5 (ref 5.0–8.0)

## 2021-10-05 LAB — VITAMIN D 25 HYDROXY (VIT D DEFICIENCY, FRACTURES): VITD: 32.63 ng/mL (ref 30.00–100.00)

## 2021-10-05 LAB — LIPID PANEL
Cholesterol: 92 mg/dL (ref 0–200)
HDL: 38.9 mg/dL — ABNORMAL LOW (ref >39.00)
LDL Cholesterol: 39 mg/dL (ref 0–99)
NonHDL: 52.91
Total CHOL/HDL Ratio: 2
Triglycerides: 72 mg/dL (ref 0.0–149.0)
VLDL: 14.4 mg/dL (ref 0.0–40.0)

## 2021-10-05 LAB — TSH: TSH: 1.76 u[IU]/mL (ref 0.35–5.50)

## 2021-10-05 LAB — HEMOGLOBIN A1C: Hgb A1c MFr Bld: 8.5 % — ABNORMAL HIGH (ref 4.6–6.5)

## 2021-10-05 MED ORDER — RYBELSUS 3 MG PO TABS: 3.0000 mg | ORAL_TABLET | Freq: Every day | ORAL | 3 refills | Status: DC
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
## 2021-10-05 NOTE — Patient Instructions (Addendum)
Please check with the insurance about coverage for the [place] instead of the [place], or we could consider adding low dose Lantus insulin once per day if needed for A1c > 7.5  Please continue all other medications as before, and refills have been done if requested.  Please have the pharmacy call with any other refills you may need.  Please continue your efforts at being more active, low cholesterol diet, and weight control.  You are otherwise up to date with prevention measures today.  Please keep your appointments with your specialists as you may have planned  Please go to the LAB at the blood drawing area for the tests to be done  You will be contacted by phone if any changes need to be made immediately.  Otherwise, you will receive a letter about your results with an explanation, but please check with MyChart first.  Please remember to sign up for MyChart if you have not done so, as this will be important to you in the future with finding out test results, communicating by private email, and scheduling acute appointments online when needed.  Please make an Appointment to return in 6 months, or sooner if needed

## 2021-10-06 ENCOUNTER — Encounter: Payer: Self-pay | Admitting: Internal Medicine

## 2021-10-06 NOTE — Assessment & Plan Note (Signed)
Lab Results  Component Value Date   HGBA1C 8.5 (H) 10/05/2021   uncontrolled, pt to continue current medical treatment jardiance, metfomrin, actos and add rybelsus 3 mg

## 2021-10-06 NOTE — Assessment & Plan Note (Signed)
Stable, cont eliquis asd

## 2021-10-06 NOTE — Assessment & Plan Note (Signed)
Lab Results  Component Value Date   LDLCALC 39 10/05/2021   Stable, pt to continue current statin crestor 40

## 2021-10-06 NOTE — Assessment & Plan Note (Signed)
Lab Results  Component Value Date   WBC 5.2 10/05/2021   HGB 13.5 10/05/2021   HCT 40.2 10/05/2021   MCV 90.7 10/05/2021   PLT 101.0 (L) 10/05/2021  chronic mild stable, cont to follow, no overt bleeding

## 2021-10-06 NOTE — Assessment & Plan Note (Signed)
Overall stable rate and volume,  to f/u any worsening symptoms or concerns, cont current med tx -

## 2021-10-06 NOTE — Assessment & Plan Note (Signed)
Age and sex appropriate education and counseling updated with regular exercise and diet Referrals for preventative services - plans to call for his eye exam soon Immunizations addressed - declines covid bosoter, shingrix Smoking counseling  - none needed Evidence for depression or other mood disorder - none significant Most recent labs reviewed. I have personally reviewed and have noted: 1) the patient's medical and social history 2) The patient's current medications and supplements 3) The patient's height, weight, and BMI have been recorded in the chart

## 2021-10-06 NOTE — Assessment & Plan Note (Signed)
Lab Results  Component Value Date   VITAMINB12 579 10/05/2021   Stable, cont oral replacement - b12 1000 mcg qd

## 2021-10-06 NOTE — Assessment & Plan Note (Signed)
BP Readings from Last 3 Encounters:  10/05/21 108/60  07/03/21 100/65  05/10/21 110/60   Stable, pt to continue medical treatment vasotec, hct

## 2021-10-17 DIAGNOSIS — H5213 Myopia, bilateral: Secondary | ICD-10-CM | POA: Diagnosis not present

## 2021-10-17 DIAGNOSIS — H524 Presbyopia: Secondary | ICD-10-CM | POA: Diagnosis not present

## 2021-10-18 ENCOUNTER — Other Ambulatory Visit: Payer: Self-pay | Admitting: Cardiology

## 2021-11-13 ENCOUNTER — Telehealth: Payer: Self-pay | Admitting: Internal Medicine

## 2021-11-13 NOTE — Telephone Encounter (Signed)
Patient called in ? ?Says he is being charged a Optometrist from [employer] for a 20 min telephone conversation ? ?Patient is requesting Dr. Jonny Ruiz fax over a authorization of service form so patient does not have to be charged for this service.Marland Kitchen DOS 12.08.22 (Claim # [id number]) ? ?Fax # [phone] ? ?Patient also wanting to know if provider would be willing to switch him from the Semaglutide (RYBELSUS) 3 MG TABS back to the Glipizide that he was previously taking last year.. patient says he feels like his blood sugar levels were controlled better on the Glipizide ? ?Please fu w/ patient [phone] ?

## 2021-11-14 NOTE — Telephone Encounter (Signed)
Already answered ? ?We do not do restrospective referrals, and see other note about the glipizide and rybelsus ?

## 2021-11-14 NOTE — Telephone Encounter (Signed)
Patient discovered [facility] through a [employer] and had a virtual visit with them to discuss other treatment options for diabetes. After speaking with them, patient did not wish to continue care with them. [employer] covered most of vist, however [employer] needs a letter stating that Dr. Jonny Ruiz authorizes them to cover the remaining cost of patient's visit for services rendered.  ? ?Patient states that he does not want to continue to pay the price for Rybelsus. Per patient, would like to go back to Glipizide  ?

## 2021-11-14 NOTE — Telephone Encounter (Signed)
Sorry, I dont have anything to do with that kind of billing issue ? ?Also, I would ask if he wants to increase the rybelsus if not working as well as the glipizide, as this would be a good next step ?

## 2021-11-26 ENCOUNTER — Telehealth: Payer: Self-pay | Admitting: Internal Medicine

## 2021-11-26 ENCOUNTER — Other Ambulatory Visit: Payer: Self-pay | Admitting: Cardiology

## 2021-11-26 NOTE — Telephone Encounter (Signed)
Pt requesting a cb/ pt did not give detail on nature of the call ?

## 2021-11-26 NOTE — Telephone Encounter (Signed)
Patient asking once again to switch back to glipizide. Per patient, he feels that this worked better with his blood sugar and it's inexpensive. Rybelsus costs $125 and he is not willing to pay this every 90 days ?

## 2021-11-27 NOTE — Telephone Encounter (Signed)
I understand about the cost of the medication so he will need to stop ? ?Unfortunately, his A1c was 8.5 when he stopped the glipizide xl 10 mg, so going back to that is not a good idea since it was not adequate treatment ? ?The next step is to start insulin.  We can start now, or he can have ROV to discuss.  Eventually after starting we will be able to cut back on his other diabetic pills to one or two only if he is on the insulin as well ? ?Please ask also if he would want referral to DM education ? ? ?

## 2021-11-27 NOTE — Telephone Encounter (Signed)
Left message for patient to call me back. 

## 2021-12-20 ENCOUNTER — Telehealth: Payer: Self-pay | Admitting: Internal Medicine

## 2021-12-20 MED ORDER — ROSUVASTATIN CALCIUM 40 MG PO TABS: 40.0000 mg | ORAL_TABLET | Freq: Every day | ORAL | 1 refills | Status: DC

## 2021-12-20 MED ORDER — PIOGLITAZONE HCL 45 MG PO TABS: 45.0000 mg | ORAL_TABLET | Freq: Every day | ORAL | 1 refills | Status: DC

## 2021-12-20 NOTE — Telephone Encounter (Signed)
Patient needs rosuvastatin and Actos  -   Patient would also like to get off the Rybelsus and go back to the generic of what he was on before.  He is not sure of the name.  Rybelsus is too expensive and not working. Please advise.  Prescriptions need to be sent to [facility] pharmacy.  Please call patient and let him know when this has been done.  Please call on cell # [phone]. ? ?

## 2021-12-20 NOTE — Telephone Encounter (Signed)
Refills sent; appt made for patient to discuss insulin ?

## 2021-12-24 ENCOUNTER — Ambulatory Visit (INDEPENDENT_AMBULATORY_CARE_PROVIDER_SITE_OTHER): Payer: Medicare HMO | Admitting: Internal Medicine

## 2021-12-24 ENCOUNTER — Encounter: Payer: Self-pay | Admitting: Internal Medicine

## 2021-12-24 VITALS — BP 122/78 | HR 70 | Temp 98.8°F | Ht 75.0 in | Wt 230.0 lb

## 2021-12-24 DIAGNOSIS — E538 Deficiency of other specified B group vitamins: Secondary | ICD-10-CM

## 2021-12-24 DIAGNOSIS — E7800 Pure hypercholesterolemia, unspecified: Secondary | ICD-10-CM | POA: Diagnosis not present

## 2021-12-24 DIAGNOSIS — I10 Essential (primary) hypertension: Secondary | ICD-10-CM

## 2021-12-24 DIAGNOSIS — E1165 Type 2 diabetes mellitus with hyperglycemia: Secondary | ICD-10-CM

## 2021-12-24 LAB — POCT GLYCOSYLATED HEMOGLOBIN (HGB A1C): Hemoglobin A1C: 8.6 % — AB (ref 4.0–5.6)

## 2021-12-24 MED ORDER — GLIPIZIDE ER 10 MG PO TB24: 10.0000 mg | ORAL_TABLET | Freq: Every day | ORAL | 3 refills | Status: DC

## 2021-12-24 MED ORDER — EMPAGLIFLOZIN 25 MG PO TABS: 25.0000 mg | ORAL_TABLET | Freq: Every day | ORAL | 3 refills | Status: DC

## 2021-12-24 NOTE — Patient Instructions (Addendum)
Ok to restart the glucotrol XL 10 mg per day ? ?We will need to finish the paperwork for the Jardiance patient assist program to send in ? ?Please continue all other medications as before, and refills have been done if requested. ? ?Please have the pharmacy call with any other refills you may need. ? ?Please continue your efforts at being more active, low cholesterol diabetic diet, and weight control. ? ?Please keep your appointments with your specialists as you may have planned ? ?Please make an Appointment to return in 3 months, or sooner if needed, also with Lab Appointment for testing done 3-5 days before at the [facility] (so this is for TWO appointments - please see the scheduling desk as you leave) ? ?Due to the ongoing Covid 19 pandemic, our lab now requires an appointment for any labs done at our office.  If you need labs done and do not have an appointment, please call our office ahead of time to schedule before presenting to the lab for your testing. ? ? ? ? ?
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
## 2021-12-25 ENCOUNTER — Encounter: Payer: Self-pay | Admitting: *Deleted

## 2021-12-29 ENCOUNTER — Encounter: Payer: Self-pay | Admitting: Internal Medicine

## 2021-12-29 NOTE — Assessment & Plan Note (Signed)
Lab Results  ?Component Value Date  ? LDLCALC 39 10/05/2021  ? ?Stable, pt to continue current statin crestor 40 ? ?

## 2021-12-29 NOTE — Assessment & Plan Note (Signed)
BP Readings from Last 3 Encounters:  ?12/24/21 122/78  ?10/05/21 108/60  ?07/03/21 100/65  ? ?Stable, pt to continue medical treatment vasotec 5 ? ?

## 2021-12-29 NOTE — Assessment & Plan Note (Signed)
Lab Results  ?Component Value Date  ? HGBA1C 8.6 (A) 12/24/2021  ? ?Moderate uncontrolled, rybelsus too expensiv, pt to restart glucotrol xl 10, restart jardiance asap after pt assist paperwork done,, pt to continue current medical treatment metformin, actos, work on better diet, hold on insulin or ozempic type med today per pt reqeust, and f/u lab next visit 3 mo ? ?

## 2021-12-29 NOTE — Assessment & Plan Note (Signed)
Lab Results  ?Component Value Date  ? [id number] 579 10/05/2021  ? ?Stable, cont oral replacement - b12 1000 mcg qd ? ?

## 2022-01-14 ENCOUNTER — Telehealth: Payer: Self-pay | Admitting: Internal Medicine

## 2022-01-14 MED ORDER — EMPAGLIFLOZIN 25 MG PO TABS: 25.0000 mg | ORAL_TABLET | Freq: Every day | ORAL | 3 refills | Status: DC

## 2022-01-14 NOTE — Telephone Encounter (Signed)
1.Medication Requested: ?empagliflozin (JARDIANCE) 25 MG TABS tablet ?2. Pharmacy (Name, Street, [place]): ?[facility] - [place], [place] - [address] Phone:  [phone]  ?Fax:  [phone]  ?  ? ?3. On Med List: yes ? ?4. Last Visit with PCP: ? ?5. Next visit date with PCP: ? ? ?Agent: Please be advised that RX refills may take up to 3 business days. We ask that you follow-up with your pharmacy.  ?

## 2022-01-14 NOTE — Telephone Encounter (Signed)
Prescription sent to pharmacy.

## 2022-03-28 ENCOUNTER — Ambulatory Visit: Payer: Medicare HMO | Admitting: Internal Medicine

## 2022-04-04 ENCOUNTER — Ambulatory Visit (INDEPENDENT_AMBULATORY_CARE_PROVIDER_SITE_OTHER): Payer: Medicare HMO | Admitting: Internal Medicine

## 2022-04-04 ENCOUNTER — Encounter: Payer: Self-pay | Admitting: Internal Medicine

## 2022-04-04 VITALS — BP 114/68 | HR 63 | Temp 97.9°F | Ht 75.0 in | Wt 220.0 lb

## 2022-04-04 DIAGNOSIS — E538 Deficiency of other specified B group vitamins: Secondary | ICD-10-CM | POA: Diagnosis not present

## 2022-04-04 DIAGNOSIS — E7800 Pure hypercholesterolemia, unspecified: Secondary | ICD-10-CM | POA: Diagnosis not present

## 2022-04-04 DIAGNOSIS — I10 Essential (primary) hypertension: Secondary | ICD-10-CM

## 2022-04-04 DIAGNOSIS — E1165 Type 2 diabetes mellitus with hyperglycemia: Secondary | ICD-10-CM | POA: Diagnosis not present

## 2022-04-04 DIAGNOSIS — E559 Vitamin D deficiency, unspecified: Secondary | ICD-10-CM

## 2022-04-04 LAB — BASIC METABOLIC PANEL
BUN: 22 mg/dL (ref 6–23)
CO2: 27 mEq/L (ref 19–32)
Calcium: 9.5 mg/dL (ref 8.4–10.5)
Chloride: 104 mEq/L (ref 96–112)
Creatinine, Ser: 0.94 mg/dL (ref 0.40–1.50)
GFR: 76.67 mL/min (ref >60.00)
Glucose, Bld: 117 mg/dL — ABNORMAL HIGH (ref 70–99)
Potassium: 4.4 mEq/L (ref 3.5–5.1)
Sodium: 140 mEq/L (ref 135–145)

## 2022-04-04 LAB — LIPID PANEL
Cholesterol: 102 mg/dL (ref 0–200)
HDL: 46.1 mg/dL (ref >39.00)
LDL Cholesterol: 38 mg/dL (ref 0–99)
NonHDL: 55.98
Total CHOL/HDL Ratio: 2
Triglycerides: 88 mg/dL (ref 0.0–149.0)
VLDL: 17.6 mg/dL (ref 0.0–40.0)

## 2022-04-04 LAB — HEPATIC FUNCTION PANEL
ALT: 12 U/L (ref 0–53)
AST: 20 U/L (ref 0–37)
Albumin: 4.7 g/dL (ref 3.5–5.2)
Alkaline Phosphatase: 51 U/L (ref 39–117)
Bilirubin, Direct: 0.3 mg/dL (ref 0.0–0.3)
Total Bilirubin: 1.1 mg/dL (ref 0.2–1.2)
Total Protein: 7.8 g/dL (ref 6.0–8.3)

## 2022-04-04 LAB — VITAMIN B12: Vitamin B-12: 558 pg/mL (ref 211–911)

## 2022-04-04 LAB — HEMOGLOBIN A1C: Hgb A1c MFr Bld: 7.4 % — ABNORMAL HIGH (ref 4.6–6.5)

## 2022-04-04 LAB — VITAMIN D 25 HYDROXY (VIT D DEFICIENCY, FRACTURES): VITD: 31.89 ng/mL (ref 30.00–100.00)
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
## 2022-04-04 NOTE — Patient Instructions (Addendum)
Please continue all other medications as before, and refills have been done if requested.  Please have the pharmacy call with any other refills you may need.  Please continue your efforts at being more active, low cholesterol diet, and weight control.  Please keep your appointments with your specialists as you may have planned  Please go to the LAB at the blood drawing area for the tests to be done  You will be contacted by phone if any changes need to be made immediately.  Otherwise, you will receive a letter about your results with an explanation, but please check with MyChart first.  Please remember to sign up for MyChart if you have not done so, as this will be important to you in the future with finding out test results, communicating by private email, and scheduling acute appointments online when needed.  Please make an Appointment to return in 6 months, or sooner if needed, also with Lab Appointment for testing done 3-5 days before at the FIRST FLOOR Lab (so this is for TWO appointments - please see the scheduling desk as you leave)  

## 2022-04-06 ENCOUNTER — Encounter: Payer: Self-pay | Admitting: Internal Medicine

## 2022-04-06 NOTE — Assessment & Plan Note (Signed)
BP Readings from Last 3 Encounters:  04/04/22 114/68  12/24/21 122/78  10/05/21 108/60   Stable, pt to continue medical treatment vasotec 5 mg qd

## 2022-04-06 NOTE — Assessment & Plan Note (Signed)
Lab Results  Component Value Date   LDLCALC 38 04/04/2022   Stable, pt to continue current statin crestor 40 mg qd

## 2022-04-06 NOTE — Assessment & Plan Note (Signed)
Lab Results  Component Value Date   HGBA1C 7.4 (H) 04/04/2022   Mild uncontrolled, pt to continue current medical treatment jardiance 2, metformin 1000 bi, actos 45 mg qd, as delcines further tx

## 2022-04-06 NOTE — Assessment & Plan Note (Signed)
Lab Results  Component Value Date   VITAMINB12 558 04/04/2022   Stable, cont oral replacement - b12 1000 mcg qd

## 2022-04-09 ENCOUNTER — Other Ambulatory Visit: Payer: Self-pay | Admitting: Internal Medicine

## 2022-05-10 ENCOUNTER — Inpatient Hospital Stay: Payer: Medicare HMO | Admitting: Hematology and Oncology

## 2022-05-10 ENCOUNTER — Other Ambulatory Visit: Payer: Self-pay

## 2022-05-10 ENCOUNTER — Other Ambulatory Visit: Payer: Self-pay | Admitting: Hematology and Oncology

## 2022-05-10 ENCOUNTER — Inpatient Hospital Stay: Payer: Medicare HMO | Attending: Hematology and Oncology

## 2022-05-10 VITALS — BP 136/84 | HR 61 | Temp 98.0°F | Resp 15 | Wt 231.3 lb

## 2022-05-10 DIAGNOSIS — Z7901 Long term (current) use of anticoagulants: Secondary | ICD-10-CM | POA: Insufficient documentation

## 2022-05-10 DIAGNOSIS — I4891 Unspecified atrial fibrillation: Secondary | ICD-10-CM | POA: Diagnosis not present

## 2022-05-10 DIAGNOSIS — E119 Type 2 diabetes mellitus without complications: Secondary | ICD-10-CM | POA: Insufficient documentation

## 2022-05-10 DIAGNOSIS — E538 Deficiency of other specified B group vitamins: Secondary | ICD-10-CM | POA: Insufficient documentation

## 2022-05-10 DIAGNOSIS — D696 Thrombocytopenia, unspecified: Secondary | ICD-10-CM

## 2022-05-10 LAB — CMP (CANCER CENTER ONLY)
ALT: 16 U/L (ref 0–44)
AST: 20 U/L (ref 15–41)
Albumin: 4.4 g/dL (ref 3.5–5.0)
Alkaline Phosphatase: 54 U/L (ref 38–126)
Anion gap: 5 (ref 5–15)
BUN: 22 mg/dL (ref 8–23)
CO2: 27 mmol/L (ref 22–32)
Calcium: 9.6 mg/dL (ref 8.9–10.3)
Chloride: 107 mmol/L (ref 98–111)
Creatinine: 0.89 mg/dL (ref 0.61–1.24)
GFR, Estimated: >60 mL/min
Glucose, Bld: 176 mg/dL — ABNORMAL HIGH (ref 70–99)
Potassium: 4 mmol/L (ref 3.5–5.1)
Sodium: 139 mmol/L (ref 135–145)
Total Bilirubin: 1.1 mg/dL (ref 0.3–1.2)
Total Protein: 7.2 g/dL (ref 6.5–8.1)

## 2022-05-10 LAB — CBC WITH DIFFERENTIAL (CANCER CENTER ONLY)
Abs Immature Granulocytes: 0.01 K/uL (ref 0.00–0.07)
Basophils Absolute: 0 K/uL (ref 0.0–0.1)
Basophils Relative: 1 %
Eosinophils Absolute: 0.1 K/uL (ref 0.0–0.5)
Eosinophils Relative: 3 %
HCT: 39.1 % (ref 39.0–52.0)
Hemoglobin: 13.4 g/dL (ref 13.0–17.0)
Immature Granulocytes: 0 %
Lymphocytes Relative: 22 %
Lymphs Abs: 0.9 K/uL (ref 0.7–4.0)
MCH: 31 pg (ref 26.0–34.0)
MCHC: 34.3 g/dL (ref 30.0–36.0)
MCV: 90.5 fL (ref 80.0–100.0)
Monocytes Absolute: 0.4 K/uL (ref 0.1–1.0)
Monocytes Relative: 10 %
Neutro Abs: 2.8 K/uL (ref 1.7–7.7)
Neutrophils Relative %: 64 %
Platelet Count: 75 K/uL — ABNORMAL LOW (ref 150–400)
RBC: 4.32 MIL/uL (ref 4.22–5.81)
RDW: 14.2 % (ref 11.5–15.5)
WBC Count: 4.4 K/uL (ref 4.0–10.5)
nRBC: 0 % (ref 0.0–0.2)

## 2022-05-10 LAB — IMMATURE PLATELET FRACTION: Immature Platelet Fraction: 7.5 % (ref 1.2–8.6)
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
## 2022-07-10 ENCOUNTER — Other Ambulatory Visit: Payer: Self-pay

## 2022-07-10 ENCOUNTER — Other Ambulatory Visit: Payer: Self-pay | Admitting: Internal Medicine

## 2022-07-10 MED ORDER — METFORMIN HCL 1000 MG PO TABS: 1000.0000 mg | ORAL_TABLET | Freq: Two times a day (BID) | ORAL | 1 refills | Status: DC

## 2022-07-10 MED ORDER — ENALAPRIL MALEATE 5 MG PO TABS: 5.0000 mg | ORAL_TABLET | Freq: Every day | ORAL | 3 refills | Status: DC

## 2022-07-10 NOTE — Telephone Encounter (Signed)
Please refill as per office routine med refill policy (all routine meds to be refilled for 3 mo or monthly (per pt preference) up to one year from last visit, then month to month grace period for 3 mo, then further med refills will have to be denied) ? ?

## 2022-07-11 ENCOUNTER — Other Ambulatory Visit: Payer: Self-pay | Admitting: Internal Medicine

## 2022-07-18 ENCOUNTER — Other Ambulatory Visit: Payer: Self-pay | Admitting: Internal Medicine

## 2022-07-18 MED ORDER — ROSUVASTATIN CALCIUM 40 MG PO TABS: 40.0000 mg | ORAL_TABLET | Freq: Every day | ORAL | 2 refills | Status: DC

## 2022-07-18 MED ORDER — PIOGLITAZONE HCL 45 MG PO TABS: 45.0000 mg | ORAL_TABLET | Freq: Every day | ORAL | 2 refills | Status: DC

## 2022-07-18 MED ORDER — ENALAPRIL MALEATE 5 MG PO TABS: 5.0000 mg | ORAL_TABLET | Freq: Every day | ORAL | 2 refills | Status: DC
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
## 2022-10-08 ENCOUNTER — Encounter: Payer: Self-pay | Admitting: Internal Medicine

## 2022-10-08 ENCOUNTER — Ambulatory Visit (INDEPENDENT_AMBULATORY_CARE_PROVIDER_SITE_OTHER): Payer: Medicare HMO | Admitting: Internal Medicine

## 2022-10-08 VITALS — BP 120/64 | HR 72 | Temp 97.9°F | Ht 75.0 in | Wt 234.0 lb

## 2022-10-08 DIAGNOSIS — E538 Deficiency of other specified B group vitamins: Secondary | ICD-10-CM | POA: Diagnosis not present

## 2022-10-08 DIAGNOSIS — E1165 Type 2 diabetes mellitus with hyperglycemia: Secondary | ICD-10-CM | POA: Diagnosis not present

## 2022-10-08 DIAGNOSIS — Z0001 Encounter for general adult medical examination with abnormal findings: Secondary | ICD-10-CM

## 2022-10-08 DIAGNOSIS — D696 Thrombocytopenia, unspecified: Secondary | ICD-10-CM | POA: Diagnosis not present

## 2022-10-08 DIAGNOSIS — E7800 Pure hypercholesterolemia, unspecified: Secondary | ICD-10-CM

## 2022-10-08 DIAGNOSIS — I10 Essential (primary) hypertension: Secondary | ICD-10-CM | POA: Diagnosis not present

## 2022-10-08 DIAGNOSIS — D509 Iron deficiency anemia, unspecified: Secondary | ICD-10-CM

## 2022-10-08 DIAGNOSIS — E559 Vitamin D deficiency, unspecified: Secondary | ICD-10-CM | POA: Diagnosis not present

## 2022-10-08 LAB — MICROALBUMIN / CREATININE URINE RATIO
Creatinine,U: 52 mg/dL
Microalb Creat Ratio: 34.4 mg/g — ABNORMAL HIGH (ref 0.0–30.0)
Microalb, Ur: 17.9 mg/dL — ABNORMAL HIGH (ref 0.0–1.9)

## 2022-10-08 LAB — CBC WITH DIFFERENTIAL/PLATELET
Basophils Absolute: 0 10*3/uL (ref 0.0–0.1)
Basophils Relative: 0.8 % (ref 0.0–3.0)
Eosinophils Absolute: 0.1 10*3/uL (ref 0.0–0.7)
Eosinophils Relative: 3 % (ref 0.0–5.0)
HCT: 44 % (ref 39.0–52.0)
Hemoglobin: 14.9 g/dL (ref 13.0–17.0)
Lymphocytes Relative: 26.3 % (ref 12.0–46.0)
Lymphs Abs: 1.3 10*3/uL (ref 0.7–4.0)
MCHC: 33.9 g/dL (ref 30.0–36.0)
MCV: 91.2 fl (ref 78.0–100.0)
Monocytes Absolute: 0.5 10*3/uL (ref 0.1–1.0)
Monocytes Relative: 10.4 % (ref 3.0–12.0)
Neutro Abs: 2.9 10*3/uL (ref 1.4–7.7)
Neutrophils Relative %: 59.5 % (ref 43.0–77.0)
Platelets: 110 10*3/uL — ABNORMAL LOW (ref 150.0–400.0)
RBC: 4.83 Mil/uL (ref 4.22–5.81)
RDW: 14.8 % (ref 11.5–15.5)
WBC: 4.9 10*3/uL (ref 4.0–10.5)

## 2022-10-08 LAB — BASIC METABOLIC PANEL
BUN: 30 mg/dL — ABNORMAL HIGH (ref 6–23)
CO2: 24 mEq/L (ref 19–32)
Calcium: 10 mg/dL (ref 8.4–10.5)
Chloride: 104 mEq/L (ref 96–112)
Creatinine, Ser: 0.93 mg/dL (ref 0.40–1.50)
GFR: 77.38 mL/min (ref >60.00)
Glucose, Bld: 102 mg/dL — ABNORMAL HIGH (ref 70–99)
Potassium: 4.6 mEq/L (ref 3.5–5.1)
Sodium: 139 mEq/L (ref 135–145)

## 2022-10-08 LAB — IBC PANEL
Iron: 93 ug/dL (ref 42–165)
Saturation Ratios: 19.4 % — ABNORMAL LOW (ref 20.0–50.0)
TIBC: 478.8 ug/dL — ABNORMAL HIGH (ref 250.0–450.0)
Transferrin: 342 mg/dL (ref 212.0–360.0)

## 2022-10-08 LAB — FERRITIN: Ferritin: 48.9 ng/mL (ref 22.0–322.0)

## 2022-10-08 LAB — URINALYSIS, ROUTINE W REFLEX MICROSCOPIC
Bilirubin Urine: NEGATIVE
Hgb urine dipstick: NEGATIVE
Ketones, ur: NEGATIVE
Leukocytes,Ua: NEGATIVE
Nitrite: NEGATIVE
RBC / HPF: NONE SEEN (ref 0–?)
Specific Gravity, Urine: 1.02 (ref 1.000–1.030)
Urine Glucose: >=1000 — AB
Urobilinogen, UA: 0.2 (ref 0.0–1.0)
pH: 5.5 (ref 5.0–8.0)

## 2022-10-08 LAB — LIPID PANEL
Cholesterol: 107 mg/dL (ref 0–200)
HDL: 44.4 mg/dL (ref >39.00)
LDL Cholesterol: 47 mg/dL (ref 0–99)
NonHDL: 62.53
Total CHOL/HDL Ratio: 2
Triglycerides: 76 mg/dL (ref 0.0–149.0)
VLDL: 15.2 mg/dL (ref 0.0–40.0)

## 2022-10-08 LAB — HEPATIC FUNCTION PANEL
ALT: 14 U/L (ref 0–53)
AST: 22 U/L (ref 0–37)
Albumin: 4.9 g/dL (ref 3.5–5.2)
Alkaline Phosphatase: 55 U/L (ref 39–117)
Bilirubin, Direct: 0.2 mg/dL (ref 0.0–0.3)
Total Bilirubin: 0.8 mg/dL (ref 0.2–1.2)
Total Protein: 8.3 g/dL (ref 6.0–8.3)

## 2022-10-08 LAB — VITAMIN D 25 HYDROXY (VIT D DEFICIENCY, FRACTURES): VITD: 35.25 ng/mL (ref 30.00–100.00)

## 2022-10-08 LAB — HEMOGLOBIN A1C: Hgb A1c MFr Bld: 7.8 % — ABNORMAL HIGH (ref 4.6–6.5)

## 2022-10-08 LAB — TSH: TSH: 2.66 u[IU]/mL (ref 0.35–5.50)

## 2022-10-08 LAB — VITAMIN B12: Vitamin B-12: 308 pg/mL (ref 211–911)

## 2022-10-08 NOTE — Assessment & Plan Note (Signed)
Lab Results  Component Value Date   WBC 4.9 10/08/2022   HGB 14.9 10/08/2022   HCT 44.0 10/08/2022   MCV 91.2 10/08/2022   PLT 110.0 (L) 10/08/2022   Also for f/u ion level

## 2022-10-08 NOTE — Assessment & Plan Note (Signed)
Lab Results  Component Value Date   PLT 110.0 (L) 10/08/2022   Stable, f/u heme as planned

## 2022-10-08 NOTE — Assessment & Plan Note (Signed)
Lab Results  Component Value Date   LDLCALC 47 10/08/2022   Stable, pt to continue current statin crestor 40 mg qd

## 2022-10-08 NOTE — Assessment & Plan Note (Signed)
Age and sex appropriate education and counseling updated with regular exercise and diet Referrals for preventative services - for eye exam feb 7 Immunizations addressed - none needed Smoking counseling  - none needed Evidence for depression or other mood disorder - none significant Most recent labs reviewed. I have personally reviewed and have noted: 1) the patient's medical and social history 2) The patient's current medications and supplements 3) The patient's height, weight, and BMI have been recorded in the chart

## 2022-10-08 NOTE — Patient Instructions (Signed)

## 2022-10-08 NOTE — Assessment & Plan Note (Signed)
BP Readings from Last 3 Encounters:  10/08/22 120/64  05/10/22 136/84  04/04/22 114/68   Stable, pt to continue medical treatment vasotec 5 mg qd

## 2022-10-08 NOTE — Assessment & Plan Note (Signed)
Lab Results  Component Value Date   HGBA1C 7.8 (H) 10/08/2022   Uncontrolled, goal A1c < 7.5 for age, pt to continue current medical treatment jaridance 25 mg , glipziide xl 10 mg qd, metformin 100 bid, actos 45 mg qd, declines further change in tx due to polypharmacy

## 2022-10-08 NOTE — Assessment & Plan Note (Signed)
Lab Results  Component Value Date   VITAMINB12 308 10/08/2022   Low, to start oral replacement - b12 1000 mcg qd
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
## 2022-10-10 ENCOUNTER — Ambulatory Visit: Payer: Medicare HMO | Attending: Cardiology | Admitting: Cardiology

## 2022-10-10 ENCOUNTER — Encounter: Payer: Self-pay | Admitting: Cardiology

## 2022-10-10 VITALS — BP 110/60 | HR 77 | Ht 76.0 in | Wt 233.2 lb

## 2022-10-10 DIAGNOSIS — E7800 Pure hypercholesterolemia, unspecified: Secondary | ICD-10-CM

## 2022-10-10 DIAGNOSIS — I10 Essential (primary) hypertension: Secondary | ICD-10-CM

## 2022-10-10 DIAGNOSIS — I2510 Atherosclerotic heart disease of native coronary artery without angina pectoris: Secondary | ICD-10-CM | POA: Diagnosis not present

## 2022-10-10 DIAGNOSIS — I4821 Permanent atrial fibrillation: Secondary | ICD-10-CM

## 2022-10-10 NOTE — Patient Instructions (Signed)
    Follow-Up: At [facility], you and your health needs are our priority.  As part of our continuing mission to provide you with exceptional heart care, we have created designated Provider Care Teams.  These Care Teams include your primary Cardiologist (physician) and Advanced Practice Providers (APPs -  Physician Assistants and Nurse Practitioners) who all work together to provide you with the care you need, when you need it.  We recommend signing up for the patient portal called "MyChart".  Sign up information is provided on this After Visit Summary.  MyChart is used to connect with patients for Virtual Visits (Telemedicine).  Patients are able to view lab/test results, encounter notes, upcoming appointments, etc.  Non-urgent messages can be sent to your provider as well.   To learn more about what you can do with MyChart, go to [email or web address].    Your next appointment:   12 month(s)  Provider:   Brian Crenshaw, MD     

## 2022-10-21 DIAGNOSIS — H5213 Myopia, bilateral: Secondary | ICD-10-CM | POA: Diagnosis not present

## 2022-10-21 DIAGNOSIS — H524 Presbyopia: Secondary | ICD-10-CM | POA: Diagnosis not present

## 2022-10-21 LAB — HM DIABETES EYE EXAM

## 2022-10-25 ENCOUNTER — Telehealth: Payer: Self-pay | Admitting: Internal Medicine

## 2022-10-25 NOTE — Telephone Encounter (Signed)
Patient has dropped off forms to be completed by the provider for his insurance to prove he has a chronic condition. He would like a call at [phone] when forms have been completed and faxed off, he may want a copy for his records as well. Forms will be in the providers box at the front.

## 2022-10-29 NOTE — Telephone Encounter (Signed)
Forms received and completion in progress

## 2022-11-22 ENCOUNTER — Telehealth: Payer: Self-pay | Admitting: Cardiology

## 2022-11-22 NOTE — Telephone Encounter (Signed)
Patient dropped off document Insurance Form, to be filled out by provider. Patient requested to send it via Fax within ASAP. Document is located in providers tray at front office.Please advise at Home    PT noted that the insurance claimed they  had not received the form on 02/20 but they have proved a separate fax number (back of the last page)

## 2022-11-22 NOTE — Telephone Encounter (Signed)
Paper Work Dropped Off: Verification of Chronic condition ([place])  Date:03.15.2024  Location of paper: mailroom back

## 2022-11-26 ENCOUNTER — Encounter: Payer: Self-pay | Admitting: *Deleted

## 2022-11-26 NOTE — Telephone Encounter (Signed)
Form received, completion in progress 

## 2022-11-26 NOTE — Telephone Encounter (Signed)
My chart message sent to patient, form has been faxed back to the number provided.

## 2023-01-07 ENCOUNTER — Other Ambulatory Visit: Payer: Self-pay | Admitting: Internal Medicine

## 2023-01-16 ENCOUNTER — Telehealth: Payer: Self-pay | Admitting: Internal Medicine

## 2023-01-16 ENCOUNTER — Other Ambulatory Visit: Payer: Self-pay

## 2023-01-16 NOTE — Telephone Encounter (Signed)
Prescription Request  01/16/2023  LOV: 10/08/2022  What is the name of the medication or equipment?  glipiZIDE (GLUCOTROL XL) 10 MG 24 hr tablet    Have you contacted your pharmacy to request a refill? No   Which pharmacy would you like this sent to?  [facility] - [place], [place] - [address] [place] [address] Phone: [phone] Fax: [phone]  [facility] [place] - [address] [place] [address] Phone: [phone] Fax: [phone]    Patient notified that their request is being sent to the clinical staff for review and that they should receive a response within 2 business days.   Please advise at Mobile [phone] (mobile)

## 2023-01-16 NOTE — Telephone Encounter (Signed)
Refill Sent. 

## 2023-01-22 ENCOUNTER — Other Ambulatory Visit: Payer: Self-pay

## 2023-01-22 MED ORDER — METFORMIN HCL 1000 MG PO TABS: 1000.0000 mg | ORAL_TABLET | Freq: Two times a day (BID) | ORAL | 1 refills | Status: DC

## 2023-01-22 NOTE — Telephone Encounter (Signed)
Prescription Request  01/22/2023  LOV: 10/08/2022  What is the name of the medication or equipment? Metformin - 90 day supply  Have you contacted your pharmacy to request a refill? Yes   Which pharmacy would you like this sent to?  [facility] - [place], [place] - [address] [place] [address] Phone: [phone] Fax: [phone]     Patient notified that their request is being sent to the clinical staff for review and that they should receive a response within 2 business days.   Please advise at Mobile [phone] (mobile)

## 2023-01-22 NOTE — Telephone Encounter (Signed)
Refill Sent. 

## 2023-02-20 ENCOUNTER — Other Ambulatory Visit: Payer: Self-pay | Admitting: Internal Medicine

## 2023-03-28 ENCOUNTER — Telehealth: Payer: Self-pay | Admitting: Hematology and Oncology

## 2023-04-08 ENCOUNTER — Ambulatory Visit: Payer: Medicare HMO | Admitting: Internal Medicine

## 2023-04-15 ENCOUNTER — Ambulatory Visit (INDEPENDENT_AMBULATORY_CARE_PROVIDER_SITE_OTHER): Payer: Medicare HMO | Admitting: Internal Medicine

## 2023-04-15 VITALS — BP 118/72 | HR 81 | Temp 98.0°F | Ht 76.0 in | Wt 233.4 lb

## 2023-04-15 DIAGNOSIS — E559 Vitamin D deficiency, unspecified: Secondary | ICD-10-CM | POA: Diagnosis not present

## 2023-04-15 DIAGNOSIS — E1165 Type 2 diabetes mellitus with hyperglycemia: Secondary | ICD-10-CM | POA: Diagnosis not present

## 2023-04-15 DIAGNOSIS — E7800 Pure hypercholesterolemia, unspecified: Secondary | ICD-10-CM

## 2023-04-15 DIAGNOSIS — I10 Essential (primary) hypertension: Secondary | ICD-10-CM | POA: Diagnosis not present

## 2023-04-15 DIAGNOSIS — E538 Deficiency of other specified B group vitamins: Secondary | ICD-10-CM | POA: Diagnosis not present

## 2023-04-15 LAB — LIPID PANEL
Cholesterol: 103 mg/dL (ref 0–200)
HDL: 42.8 mg/dL (ref >39.00)
LDL Cholesterol: 43 mg/dL (ref 0–99)
NonHDL: 60.59
Total CHOL/HDL Ratio: 2
Triglycerides: 88 mg/dL (ref 0.0–149.0)
VLDL: 17.6 mg/dL (ref 0.0–40.0)

## 2023-04-15 LAB — HEPATIC FUNCTION PANEL
ALT: 11 U/L (ref 0–53)
AST: 20 U/L (ref 0–37)
Albumin: 4.6 g/dL (ref 3.5–5.2)
Alkaline Phosphatase: 43 U/L (ref 39–117)
Bilirubin, Direct: 0.3 mg/dL (ref 0.0–0.3)
Total Bilirubin: 0.9 mg/dL (ref 0.2–1.2)
Total Protein: 7.7 g/dL (ref 6.0–8.3)

## 2023-04-15 LAB — BASIC METABOLIC PANEL
BUN: 26 mg/dL — ABNORMAL HIGH (ref 6–23)
CO2: 23 mEq/L (ref 19–32)
Calcium: 10 mg/dL (ref 8.4–10.5)
Chloride: 105 mEq/L (ref 96–112)
Creatinine, Ser: 1.02 mg/dL (ref 0.40–1.50)
GFR: 69.01 mL/min (ref >60.00)
Glucose, Bld: 121 mg/dL — ABNORMAL HIGH (ref 70–99)
Potassium: 4.2 mEq/L (ref 3.5–5.1)
Sodium: 139 mEq/L (ref 135–145)

## 2023-04-15 LAB — HEMOGLOBIN A1C: Hgb A1c MFr Bld: 7.9 % — ABNORMAL HIGH (ref 4.6–6.5)

## 2023-04-15 LAB — VITAMIN D 25 HYDROXY (VIT D DEFICIENCY, FRACTURES): VITD: 41.29 ng/mL (ref 30.00–100.00)
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
## 2023-04-15 NOTE — Patient Instructions (Signed)
Please continue all other medications as before, and refills have been done if requested.  Please have the pharmacy call with any other refills you may need.  Please continue your efforts at being more active, low cholesterol diet, and weight control.  Please keep your appointments with your specialists as you may have planned- hematology for the platelets sept 1  Please go to the LAB at the blood drawing area for the tests to be done  You will be contacted by phone if any changes need to be made immediately.  Otherwise, you will receive a letter about your results with an explanation, but please check with MyChart first.  Please make an Appointment to return in 6 months, or sooner if needed,

## 2023-04-16 ENCOUNTER — Encounter: Payer: Self-pay | Admitting: Internal Medicine

## 2023-04-16 NOTE — Assessment & Plan Note (Signed)
Lab Results  Component Value Date   HGBA1C 7.9 (H) 04/15/2023   Ucontrolled, goal A1c <7.5, pt to continue current medical treatment jardiance 25 every day, glipizide xl 10 every day, metformine 1000 bid, atos 45 every day, as delcine other change for now such as ozempic or lantus start

## 2023-04-16 NOTE — Assessment & Plan Note (Signed)
Lab Results  Component Value Date   VITAMINB12 308 10/08/2022   Low, to start oral replacement - b12 1000 mcg qd

## 2023-04-16 NOTE — Assessment & Plan Note (Signed)
Lab Results  Component Value Date   LDLCALC 43 04/15/2023   Stable, pt to continue current statin crestor 40 qd

## 2023-04-16 NOTE — Assessment & Plan Note (Signed)
BP Readings from Last 3 Encounters:  04/15/23 118/72  10/10/22 110/60  10/08/22 120/64   Stable, pt to continue medical treatment vasotec 5 qd

## 2023-05-13 ENCOUNTER — Ambulatory Visit: Payer: Medicare HMO | Admitting: Hematology and Oncology

## 2023-05-14 ENCOUNTER — Inpatient Hospital Stay: Payer: Medicare HMO

## 2023-05-14 ENCOUNTER — Other Ambulatory Visit: Payer: Self-pay | Admitting: Hematology and Oncology

## 2023-05-14 ENCOUNTER — Inpatient Hospital Stay: Payer: Medicare HMO | Attending: Hematology and Oncology | Admitting: Hematology and Oncology

## 2023-05-14 VITALS — BP 124/74 | HR 75 | Temp 97.4°F | Resp 15 | Wt 232.1 lb

## 2023-05-14 DIAGNOSIS — D696 Thrombocytopenia, unspecified: Secondary | ICD-10-CM | POA: Insufficient documentation

## 2023-05-14 DIAGNOSIS — Z800 Family history of malignant neoplasm of digestive organs: Secondary | ICD-10-CM | POA: Insufficient documentation

## 2023-05-14 DIAGNOSIS — Z7901 Long term (current) use of anticoagulants: Secondary | ICD-10-CM | POA: Insufficient documentation

## 2023-05-14 DIAGNOSIS — E538 Deficiency of other specified B group vitamins: Secondary | ICD-10-CM | POA: Diagnosis not present

## 2023-05-14 DIAGNOSIS — R161 Splenomegaly, not elsewhere classified: Secondary | ICD-10-CM | POA: Diagnosis not present

## 2023-05-14 DIAGNOSIS — Z803 Family history of malignant neoplasm of breast: Secondary | ICD-10-CM | POA: Insufficient documentation

## 2023-05-14 DIAGNOSIS — E119 Type 2 diabetes mellitus without complications: Secondary | ICD-10-CM | POA: Insufficient documentation

## 2023-05-14 DIAGNOSIS — I4891 Unspecified atrial fibrillation: Secondary | ICD-10-CM | POA: Insufficient documentation

## 2023-05-14 DIAGNOSIS — Z87891 Personal history of nicotine dependence: Secondary | ICD-10-CM | POA: Diagnosis not present

## 2023-05-14 LAB — CBC WITH DIFFERENTIAL (CANCER CENTER ONLY)
Abs Immature Granulocytes: 0.01 10*3/uL (ref 0.00–0.07)
Basophils Absolute: 0.1 10*3/uL (ref 0.0–0.1)
Basophils Relative: 1 %
Eosinophils Absolute: 0.1 10*3/uL (ref 0.0–0.5)
Eosinophils Relative: 2 %
HCT: 43.6 % (ref 39.0–52.0)
Hemoglobin: 14.9 g/dL (ref 13.0–17.0)
Immature Granulocytes: 0 %
Lymphocytes Relative: 20 %
Lymphs Abs: 1.1 10*3/uL (ref 0.7–4.0)
MCH: 31 pg (ref 26.0–34.0)
MCHC: 34.2 g/dL (ref 30.0–36.0)
MCV: 90.8 fL (ref 80.0–100.0)
Monocytes Absolute: 0.5 10*3/uL (ref 0.1–1.0)
Monocytes Relative: 9 %
Neutro Abs: 3.6 10*3/uL (ref 1.7–7.7)
Neutrophils Relative %: 68 %
Platelet Count: 102 10*3/uL — ABNORMAL LOW (ref 150–400)
RBC: 4.8 MIL/uL (ref 4.22–5.81)
RDW: 13.7 % (ref 11.5–15.5)
WBC Count: 5.4 10*3/uL (ref 4.0–10.5)
nRBC: 0 % (ref 0.0–0.2)

## 2023-05-14 LAB — CMP (CANCER CENTER ONLY)
ALT: 11 U/L (ref 0–44)
AST: 17 U/L (ref 15–41)
Albumin: 4.5 g/dL (ref 3.5–5.0)
Alkaline Phosphatase: 46 U/L (ref 38–126)
Anion gap: 7 (ref 5–15)
BUN: 28 mg/dL — ABNORMAL HIGH (ref 8–23)
CO2: 28 mmol/L (ref 22–32)
Calcium: 9.7 mg/dL (ref 8.9–10.3)
Chloride: 104 mmol/L (ref 98–111)
Creatinine: 1.08 mg/dL (ref 0.61–1.24)
GFR, Estimated: >60 mL/min (ref >60)
Glucose, Bld: 156 mg/dL — ABNORMAL HIGH (ref 70–99)
Potassium: 4.3 mmol/L (ref 3.5–5.1)
Sodium: 139 mmol/L (ref 135–145)
Total Bilirubin: 0.9 mg/dL (ref 0.3–1.2)
Total Protein: 7.6 g/dL (ref 6.5–8.1)
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
## 2023-06-11 ENCOUNTER — Other Ambulatory Visit: Payer: Self-pay

## 2023-06-11 ENCOUNTER — Other Ambulatory Visit: Payer: Self-pay | Admitting: Internal Medicine

## 2023-06-18 ENCOUNTER — Other Ambulatory Visit: Payer: Self-pay | Admitting: Internal Medicine

## 2023-06-18 ENCOUNTER — Other Ambulatory Visit: Payer: Self-pay

## 2023-07-09 ENCOUNTER — Other Ambulatory Visit: Payer: Self-pay | Admitting: Internal Medicine

## 2023-07-09 ENCOUNTER — Other Ambulatory Visit: Payer: Self-pay

## 2023-08-23 ENCOUNTER — Other Ambulatory Visit: Payer: Self-pay | Admitting: Internal Medicine

## 2023-08-25 ENCOUNTER — Other Ambulatory Visit: Payer: Self-pay
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
## 2023-10-16 ENCOUNTER — Encounter: Payer: Self-pay | Admitting: Internal Medicine

## 2023-10-16 ENCOUNTER — Ambulatory Visit (INDEPENDENT_AMBULATORY_CARE_PROVIDER_SITE_OTHER): Payer: PPO | Admitting: Internal Medicine

## 2023-10-16 VITALS — BP 124/80 | HR 73 | Temp 97.8°F | Ht 76.0 in | Wt 248.0 lb

## 2023-10-16 DIAGNOSIS — Z7984 Long term (current) use of oral hypoglycemic drugs: Secondary | ICD-10-CM

## 2023-10-16 DIAGNOSIS — D509 Iron deficiency anemia, unspecified: Secondary | ICD-10-CM | POA: Diagnosis not present

## 2023-10-16 DIAGNOSIS — Z0001 Encounter for general adult medical examination with abnormal findings: Secondary | ICD-10-CM

## 2023-10-16 DIAGNOSIS — E538 Deficiency of other specified B group vitamins: Secondary | ICD-10-CM

## 2023-10-16 DIAGNOSIS — E559 Vitamin D deficiency, unspecified: Secondary | ICD-10-CM

## 2023-10-16 DIAGNOSIS — Z7985 Long-term (current) use of injectable non-insulin antidiabetic drugs: Secondary | ICD-10-CM | POA: Diagnosis not present

## 2023-10-16 DIAGNOSIS — Z0000 Encounter for general adult medical examination without abnormal findings: Secondary | ICD-10-CM

## 2023-10-16 DIAGNOSIS — E7800 Pure hypercholesterolemia, unspecified: Secondary | ICD-10-CM

## 2023-10-16 DIAGNOSIS — E1165 Type 2 diabetes mellitus with hyperglycemia: Secondary | ICD-10-CM

## 2023-10-16 DIAGNOSIS — I10 Essential (primary) hypertension: Secondary | ICD-10-CM | POA: Diagnosis not present

## 2023-10-16 LAB — IBC PANEL
Iron: 80 ug/dL (ref 42–165)
Saturation Ratios: 18.7 % — ABNORMAL LOW (ref 20.0–50.0)
TIBC: 427 ug/dL (ref 250.0–450.0)
Transferrin: 305 mg/dL (ref 212.0–360.0)

## 2023-10-16 LAB — BASIC METABOLIC PANEL
BUN: 24 mg/dL — ABNORMAL HIGH (ref 6–23)
CO2: 28 meq/L (ref 19–32)
Calcium: 9.6 mg/dL (ref 8.4–10.5)
Chloride: 102 meq/L (ref 96–112)
Creatinine, Ser: 0.95 mg/dL (ref 0.40–1.50)
GFR: 74.89 mL/min (ref >60.00)
Glucose, Bld: 180 mg/dL — ABNORMAL HIGH (ref 70–99)
Potassium: 4.5 meq/L (ref 3.5–5.1)
Sodium: 139 meq/L (ref 135–145)

## 2023-10-16 LAB — URINALYSIS, ROUTINE W REFLEX MICROSCOPIC
Bilirubin Urine: NEGATIVE
Hgb urine dipstick: NEGATIVE
Ketones, ur: NEGATIVE
Leukocytes,Ua: NEGATIVE
Nitrite: NEGATIVE
RBC / HPF: NONE SEEN (ref 0–?)
Specific Gravity, Urine: 1.015 (ref 1.000–1.030)
Urine Glucose: NEGATIVE
Urobilinogen, UA: 0.2 (ref 0.0–1.0)
WBC, UA: NONE SEEN (ref 0–?)
pH: 7 (ref 5.0–8.0)

## 2023-10-16 LAB — CBC WITH DIFFERENTIAL/PLATELET
Basophils Absolute: 0 10*3/uL (ref 0.0–0.1)
Basophils Relative: 0.6 % (ref 0.0–3.0)
Eosinophils Absolute: 0.2 10*3/uL (ref 0.0–0.7)
Eosinophils Relative: 3.5 % (ref 0.0–5.0)
HCT: 40.3 % (ref 39.0–52.0)
Hemoglobin: 13.4 g/dL (ref 13.0–17.0)
Lymphocytes Relative: 18.5 % (ref 12.0–46.0)
Lymphs Abs: 0.9 10*3/uL (ref 0.7–4.0)
MCHC: 33.2 g/dL (ref 30.0–36.0)
MCV: 92.4 fL (ref 78.0–100.0)
Monocytes Absolute: 0.5 10*3/uL (ref 0.1–1.0)
Monocytes Relative: 10.6 % (ref 3.0–12.0)
Neutro Abs: 3.2 10*3/uL (ref 1.4–7.7)
Neutrophils Relative %: 66.8 % (ref 43.0–77.0)
Platelets: 89 10*3/uL — ABNORMAL LOW (ref 150.0–400.0)
RBC: 4.36 Mil/uL (ref 4.22–5.81)
RDW: 14.4 % (ref 11.5–15.5)
WBC: 4.9 10*3/uL (ref 4.0–10.5)

## 2023-10-16 LAB — FERRITIN: Ferritin: 86.7 ng/mL (ref 22.0–322.0)

## 2023-10-16 LAB — MICROALBUMIN / CREATININE URINE RATIO
Creatinine,U: 78.9 mg/dL
Microalb Creat Ratio: 15.6 mg/g (ref 0.0–30.0)
Microalb, Ur: 12.3 mg/dL — ABNORMAL HIGH (ref 0.0–1.9)

## 2023-10-16 LAB — HEMOGLOBIN A1C: Hgb A1c MFr Bld: 9.2 % — ABNORMAL HIGH (ref 4.6–6.5)

## 2023-10-16 LAB — LIPID PANEL
Cholesterol: 92 mg/dL (ref 0–200)
HDL: 42.1 mg/dL (ref >39.00)
LDL Cholesterol: 34 mg/dL (ref 0–99)
NonHDL: 49.63
Total CHOL/HDL Ratio: 2
Triglycerides: 77 mg/dL (ref 0.0–149.0)
VLDL: 15.4 mg/dL (ref 0.0–40.0)

## 2023-10-16 LAB — HEPATIC FUNCTION PANEL
ALT: 23 U/L (ref 0–53)
AST: 28 U/L (ref 0–37)
Albumin: 4.5 g/dL (ref 3.5–5.2)
Alkaline Phosphatase: 86 U/L (ref 39–117)
Bilirubin, Direct: 0.3 mg/dL (ref 0.0–0.3)
Total Bilirubin: 1.2 mg/dL (ref 0.2–1.2)
Total Protein: 7.8 g/dL (ref 6.0–8.3)

## 2023-10-16 LAB — VITAMIN D 25 HYDROXY (VIT D DEFICIENCY, FRACTURES): VITD: 39.11 ng/mL (ref 30.00–100.00)

## 2023-10-16 LAB — TSH: TSH: 2.2 u[IU]/mL (ref 0.35–5.50)

## 2023-10-16 MED ORDER — TIRZEPATIDE 2.5 MG/0.5ML ~~LOC~~ SOAJ
2.5000 mg | SUBCUTANEOUS | 11 refills | Status: DC
Start: 2023-10-16 — End: 2023-11-10

## 2023-10-16 NOTE — Patient Instructions (Addendum)
 Please take all new medication as prescribed - the mounjaro  2.5 mg, and call in 1 month for the next higher dose if you are taking this ok  Please continue all other medications as before, and refills have been done if requested.  Please have the pharmacy call with any other refills you may need.  Please continue your efforts at being more active, low cholesterol diet, and weight control.  You are otherwise up to date with prevention measures today.  Please keep your appointments with your specialists as you may have planned  Please go to the LAB at the blood drawing area for the tests to be done  You will be contacted by phone if any changes need to be made immediately.  Otherwise, you will receive a letter about your results with an explanation, but please check with MyChart first.  Please make an Appointment to return in 6 months, or sooner if needed
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
## 2023-10-18 ENCOUNTER — Encounter: Payer: Self-pay | Admitting: Internal Medicine

## 2023-10-18 NOTE — Assessment & Plan Note (Signed)
 Lab Results  Component Value Date   VITAMINB12 308 10/08/2022   Stable, cont oral replacement - b12 1000 mcg qd

## 2023-10-18 NOTE — Assessment & Plan Note (Signed)
 Lab Results  Component Value Date   LDLCALC 34 10/16/2023   Stable, pt to continue current statin crestor  40 qd

## 2023-10-18 NOTE — Assessment & Plan Note (Signed)
 BP Readings from Last 3 Encounters:  10/16/23 124/80  05/14/23 124/74  04/15/23 118/72   Stable, pt to continue medical treatment vasotec  5 qd

## 2023-10-18 NOTE — Assessment & Plan Note (Signed)
 No overt bleeding, bruising- for f/u iron labs

## 2023-10-18 NOTE — Assessment & Plan Note (Signed)

## 2023-10-18 NOTE — Assessment & Plan Note (Signed)
 Lab Results  Component Value Date   HGBA1C 9.2 (H) 10/16/2023   Uncontrolled with obesity, pt to continue current medical treatment jardiance  25 every day, glucotrol  xl 10 every day, actos  45 every day, metformin  1000 bid, and start mounjaro  2.5 mg weekly

## 2023-10-21 ENCOUNTER — Telehealth: Payer: Self-pay

## 2023-10-21 ENCOUNTER — Telehealth: Payer: Self-pay | Admitting: Internal Medicine

## 2023-10-21 ENCOUNTER — Ambulatory Visit: Payer: Self-pay | Admitting: Cardiology

## 2023-10-21 NOTE — Telephone Encounter (Signed)
Copied from CRM [id number]. Topic: Clinical - Prescription Issue >> Oct 21, 2023  3:47 PM Elizebeth Brooking wrote: Reason for CRM: Patient called in stating that his insurance contact him stating that his prescription tirzepatide [facility]) 2.5 MG/0.5ML Pen requires a prior authorization

## 2023-10-21 NOTE — Telephone Encounter (Signed)
Message sent to PA team.

## 2023-10-22 ENCOUNTER — Other Ambulatory Visit (HOSPITAL_COMMUNITY): Payer: Self-pay

## 2023-10-22 ENCOUNTER — Telehealth: Payer: Self-pay

## 2023-10-22 NOTE — Telephone Encounter (Signed)
Per test claim: Refill too soon. PA is not needed at this time. Medication was filled 10/16/23. Next eligible fill date is 11/06/23.

## 2023-10-22 NOTE — Telephone Encounter (Signed)
Pharmacy Patient Advocate Encounter   Received notification from Pt Calls Messages that prior authorization for [facility] is required/requested.   Insurance verification completed.   The patient is insured through [facility] ADVANTAGE/RX ADVANCE .   Per test claim: Refill too soon. PA is not needed at this time. Medication was filled 10/16/23. Next eligible fill date is 11/06/23.

## 2023-11-10 ENCOUNTER — Telehealth: Payer: Self-pay | Admitting: Internal Medicine

## 2023-11-10 MED ORDER — TIRZEPATIDE 5 MG/0.5ML ~~LOC~~ SOAJ: 5.0000 mg | SUBCUTANEOUS | 11 refills | Status: DC

## 2023-11-10 NOTE — Telephone Encounter (Signed)
 Ok done to [facility] - 5 mg weekly mounjaro

## 2023-11-10 NOTE — Telephone Encounter (Signed)
 Patient needs his monjaro refilled but he needs to up the dose to 5.0 - please send to [facility] on [address]. [facility] - Please call patient to advise - [phone]

## 2023-11-11 NOTE — Telephone Encounter (Signed)
 Called and left voice mail

## 2023-11-17 ENCOUNTER — Other Ambulatory Visit: Payer: Self-pay | Admitting: *Deleted

## 2023-11-17 MED ORDER — APIXABAN 5 MG PO TABS: 5.0000 mg | ORAL_TABLET | Freq: Two times a day (BID) | ORAL | 3 refills | Status: AC

## 2023-11-18 ENCOUNTER — Telehealth: Payer: Self-pay

## 2023-11-18 ENCOUNTER — Other Ambulatory Visit (HOSPITAL_COMMUNITY): Payer: Self-pay

## 2023-11-18 NOTE — Telephone Encounter (Unsigned)
 Copied from CRM [id number]. Topic: Clinical - Prescription Issue >> Nov 17, 2023  5:02 PM [name] wrote: Reason for CRM: pt called and stated that his medication, Shane Moreno, is needing a prior authorization. Please call and advise.

## 2023-11-18 NOTE — Telephone Encounter (Signed)
 Pharmacy Patient Advocate Encounter   Received notification from Physician's Office that prior authorization for [facility] is required/requested.   Insurance verification completed.   The patient is insured through [facility] ADVANTAGE/RX ADVANCE .   Per test claim: Approved. Unable to price co-pay due to refill too soon 11/19/23.

## 2023-11-20 ENCOUNTER — Other Ambulatory Visit (HOSPITAL_COMMUNITY): Payer: Self-pay

## 2023-12-04 ENCOUNTER — Other Ambulatory Visit: Payer: Self-pay

## 2023-12-04 ENCOUNTER — Other Ambulatory Visit: Payer: Self-pay | Admitting: Internal Medicine

## 2023-12-04 MED ORDER — ENALAPRIL MALEATE 5 MG PO TABS: 5.0000 mg | ORAL_TABLET | Freq: Every day | ORAL | 3 refills | Status: AC

## 2023-12-04 MED ORDER — PIOGLITAZONE HCL 45 MG PO TABS: 45.0000 mg | ORAL_TABLET | Freq: Every day | ORAL | 3 refills | Status: AC

## 2023-12-04 MED ORDER — METFORMIN HCL 1000 MG PO TABS: 1000.0000 mg | ORAL_TABLET | Freq: Two times a day (BID) | ORAL | 3 refills | Status: DC

## 2023-12-04 MED ORDER — ROSUVASTATIN CALCIUM 40 MG PO TABS: 40.0000 mg | ORAL_TABLET | Freq: Every day | ORAL | 3 refills | Status: AC

## 2023-12-04 MED ORDER — GLIPIZIDE ER 10 MG PO TB24: 10.0000 mg | ORAL_TABLET | Freq: Every day | ORAL | 3 refills | Status: DC

## 2023-12-04 NOTE — Telephone Encounter (Signed)
 Copied from CRM [id number]. Topic: Clinical - Medication Refill >> Dec 04, 2023  8:54 AM Shane Moreno wrote: Most Recent Primary Care Visit:  Provider: Corwin Levins  Department: [facility]  Visit Type: OFFICE VISIT  Date: 10/16/2023  Medication:  enalapril (VASOTEC) 5 MG tablet glipiZIDE (GLUCOTROL XL) 10 MG 24 hr tablet rosuvastatin (CRESTOR) 40 MG tablet pioglitazone (ACTOS) 45 MG tablet metFORMIN (GLUCOPHAGE) 1000 MG tablet, has enough of this med, but just wants a rx on file when he is ready.  Has the patient contacted their pharmacy? No, but patient is switching pharmacies and has no rxs on file.  Is this the correct pharmacy for this prescription? Yes If no, delete pharmacy and type the correct one.  This is the patient's preferred pharmacy:  [facility] [address] [place] - [address] [place] [address] Phone: [phone] Fax: [phone]  Has the prescription been filled recently? Yes  Is the patient out of the medication? No, but running low on enalapril (VASOTEC) 5 MG tablet  Has the patient been seen for an appointment in the last year OR does the patient have an upcoming appointment? Yes  Can we respond through MyChart? No  Agent: Please be advised that Rx refills may take up to 3 business days. We ask that you follow-up with your pharmacy.

## 2023-12-09 NOTE — Telephone Encounter (Signed)
error 

## 2024-01-01 ENCOUNTER — Ambulatory Visit: Admitting: Internal Medicine

## 2024-01-01 ENCOUNTER — Encounter: Payer: Self-pay | Admitting: Internal Medicine

## 2024-01-01 VITALS — BP 118/64 | HR 58 | Temp 97.8°F | Ht 76.0 in | Wt 234.0 lb

## 2024-01-01 DIAGNOSIS — Z7985 Long-term (current) use of injectable non-insulin antidiabetic drugs: Secondary | ICD-10-CM | POA: Diagnosis not present

## 2024-01-01 DIAGNOSIS — E1165 Type 2 diabetes mellitus with hyperglycemia: Secondary | ICD-10-CM | POA: Diagnosis not present

## 2024-01-01 DIAGNOSIS — Z7984 Long term (current) use of oral hypoglycemic drugs: Secondary | ICD-10-CM

## 2024-01-01 DIAGNOSIS — I10 Essential (primary) hypertension: Secondary | ICD-10-CM | POA: Diagnosis not present

## 2024-01-01 DIAGNOSIS — J309 Allergic rhinitis, unspecified: Secondary | ICD-10-CM | POA: Diagnosis not present

## 2024-01-01 LAB — POCT GLYCOSYLATED HEMOGLOBIN (HGB A1C): Hemoglobin A1C: 6.7 % — AB (ref 4.0–5.6)

## 2024-01-01 MED ORDER — METHYLPREDNISOLONE ACETATE 80 MG/ML IJ SUSP
80.0000 mg | Freq: Once | INTRAMUSCULAR | Status: AC
  Administered 2024-01-01: 80 mg via INTRAMUSCULAR

## 2024-01-01 MED ORDER — TIRZEPATIDE 7.5 MG/0.5ML ~~LOC~~ SOAJ: 7.5000 mg | SUBCUTANEOUS | 3 refills | Status: DC
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
## 2024-01-01 NOTE — Patient Instructions (Signed)
 You had the steroid shot today  Ok to increase the mounjaro to 7.5 mg weekly  Ok to stop the glipizide   Ok to stay off the Jardiance  as you have been doing  Your A1c was done today  Please continue all other medications as before, and refills have been done if requested.  Please have the pharmacy call with any other refills you may need.  Please keep your appointments with your specialists as you may have planned  Please make an Appointment to return in August 2025, or sooner if needed

## 2024-01-01 NOTE — Assessment & Plan Note (Signed)
 Mild to mod, for depomedrol 80 mg Im,,  to f/u any worsening symptoms or concerns

## 2024-01-01 NOTE — Assessment & Plan Note (Signed)
 BP Readings from Last 3 Encounters:  01/01/24 118/64  10/16/23 124/80  05/14/23 124/74   Stable, pt to continue medical treatment vasotec  5 mg qd

## 2024-01-01 NOTE — Assessment & Plan Note (Signed)
 Lab Results  Component Value Date   HGBA1C 6.7 (A) 01/01/2024   Markedly improved, ok for age to d/c glipizide , increase mounjaro 7,5 mg weekly,  pt to continue current medical treatment metformin  1000 bid, actos  45 mg but hopefully to be able to d/c these with further wt loss

## 2024-01-05 ENCOUNTER — Other Ambulatory Visit (HOSPITAL_COMMUNITY): Payer: Self-pay

## 2024-01-05 ENCOUNTER — Telehealth: Payer: Self-pay

## 2024-01-05 ENCOUNTER — Telehealth: Payer: Self-pay | Admitting: Internal Medicine

## 2024-01-05 NOTE — Telephone Encounter (Signed)
 Pharmacy Patient Advocate Encounter   Received notification from Pt Calls Messages that prior authorization for Mounjaro 7.5mg  is required/requested.   Insurance verification completed.   The patient is insured through [facility] ADVANTAGE/RX ADVANCE .   Per test claim: Refill too soon. PA is not needed at this time. Medication was filled 01/05/24. Next eligible fill date is 03/08/24.   Place a call to [facility] and the Mounjaro is ready for pick up and the copay is $117 for a 3 month supply. The patient can get either a month supply or 3 months.

## 2024-01-05 NOTE — Telephone Encounter (Signed)
 Copied from CRM [id number]. Topic: Clinical - Medication Refill >> Jan 05, 2024 11:36 AM Dimple Francis wrote: Reason for CRM: patient needing a refill for tirzepatide (MOUNJARO) 7.5 MG/0.5ML Pen, the prices has changed and he is needing a prior authorization since she went up in the dosage

## 2024-01-06 NOTE — Telephone Encounter (Signed)
 Refill already sent.

## 2024-01-27 ENCOUNTER — Telehealth: Payer: Self-pay | Admitting: Internal Medicine

## 2024-01-27 NOTE — Telephone Encounter (Signed)
 Copied from CRM [id number]. Topic: Clinical - Prescription Issue >> Jan 27, 2024  8:22 AM Essie A wrote: Reason for CRM: Patient wanted the doctor to know that since he has been off of glipiZIDE  (GLIPIZIDE  XL) 10 MG 24 hr tablet, that his blood sugar has gone up.  It has been between 100 and 120.  Wants to know if it will level off.  Please return his call at [phone]

## 2024-01-27 NOTE — Telephone Encounter (Signed)
 Yes, this should improve eventually with the mounjaro and further wt loss,  Please let us  know if the sugars are more often than not > 200  And let us  know if he would want the mounjaro incresaed to 10 mg

## 2024-01-27 NOTE — Telephone Encounter (Signed)
 Copied from CRM [id number]. Topic: General - Other >> Jan 27, 2024  8:10 AM Howard Macho wrote: Reason for CRM: patient called stating he was denied a claim and wanted to speak to the nurse. I lost connection with the patient and called him back 2x but he did not answer so I left a voicemail
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
## 2024-01-28 NOTE — Telephone Encounter (Signed)
 Called and left voice mail

## 2024-02-10 ENCOUNTER — Encounter: Payer: Self-pay | Admitting: Cardiology

## 2024-02-10 ENCOUNTER — Ambulatory Visit: Payer: PPO | Attending: Cardiology | Admitting: Cardiology

## 2024-02-10 VITALS — BP 110/60 | HR 77 | Ht 76.0 in | Wt 227.0 lb

## 2024-02-10 DIAGNOSIS — I4821 Permanent atrial fibrillation: Secondary | ICD-10-CM

## 2024-02-10 DIAGNOSIS — I10 Essential (primary) hypertension: Secondary | ICD-10-CM | POA: Diagnosis not present

## 2024-02-10 DIAGNOSIS — E7800 Pure hypercholesterolemia, unspecified: Secondary | ICD-10-CM

## 2024-02-10 DIAGNOSIS — I2510 Atherosclerotic heart disease of native coronary artery without angina pectoris: Secondary | ICD-10-CM

## 2024-02-10 NOTE — Patient Instructions (Signed)

## 2024-02-11 NOTE — Progress Notes (Signed)
 Order(s) created erroneously. Erroneous order ID: [id number]  Order moved by: Debbra Fairy  Order move date/time: 02/11/2024 3:55 PM  Source Patient: [id number]  Source Contact: 02/10/2024  Destination Patient: [id number]  Destination Contact: 02/19/2023

## 2024-02-26 ENCOUNTER — Telehealth: Payer: Self-pay | Admitting: Internal Medicine

## 2024-02-26 NOTE — Telephone Encounter (Signed)
 Copied from CRM [id number]. Topic: Clinical - Medication Question >> Feb 26, 2024 11:13 AM Alyse July wrote: Reason for CRM: Patient would like a call back pertaining to a question about tirzepatide  (MOUNJARO ) 7.5 MG/0.5ML Pen as well as his blood sugar values changed after discontinuing Glipizide .CB# [phone]

## 2024-02-27 NOTE — Telephone Encounter (Signed)
 Patient needs to talk to someone about coming off the [facility] - he is doing much better after stopping the metformin .

## 2024-03-01 NOTE — Telephone Encounter (Signed)
 Called and left voicemail letting Pt know he would need to make an OV to discuss with Dr.John.

## 2024-03-11 ENCOUNTER — Ambulatory Visit: Payer: Self-pay | Admitting: Internal Medicine

## 2024-03-11 ENCOUNTER — Ambulatory Visit: Admitting: Internal Medicine

## 2024-03-11 ENCOUNTER — Encounter: Payer: Self-pay | Admitting: Internal Medicine

## 2024-03-11 ENCOUNTER — Other Ambulatory Visit (INDEPENDENT_AMBULATORY_CARE_PROVIDER_SITE_OTHER)

## 2024-03-11 VITALS — BP 118/68 | HR 72 | Temp 98.6°F | Ht 76.0 in | Wt 225.0 lb

## 2024-03-11 DIAGNOSIS — I10 Essential (primary) hypertension: Secondary | ICD-10-CM

## 2024-03-11 DIAGNOSIS — E1165 Type 2 diabetes mellitus with hyperglycemia: Secondary | ICD-10-CM | POA: Diagnosis not present

## 2024-03-11 DIAGNOSIS — Z7985 Long-term (current) use of injectable non-insulin antidiabetic drugs: Secondary | ICD-10-CM

## 2024-03-11 DIAGNOSIS — E7800 Pure hypercholesterolemia, unspecified: Secondary | ICD-10-CM | POA: Diagnosis not present

## 2024-03-11 DIAGNOSIS — E538 Deficiency of other specified B group vitamins: Secondary | ICD-10-CM | POA: Diagnosis not present

## 2024-03-11 LAB — HEMOGLOBIN A1C: Hgb A1c MFr Bld: 7.5 % — ABNORMAL HIGH (ref 4.6–6.5)

## 2024-03-11 LAB — LIPID PANEL
Cholesterol: 88 mg/dL (ref 0–200)
HDL: 34.1 mg/dL — ABNORMAL LOW (ref >39.00)
LDL Cholesterol: 41 mg/dL (ref 0–99)
NonHDL: 53.76
Total CHOL/HDL Ratio: 3
Triglycerides: 64 mg/dL (ref 0.0–149.0)
VLDL: 12.8 mg/dL (ref 0.0–40.0)

## 2024-03-11 LAB — HEPATIC FUNCTION PANEL
ALT: 14 U/L (ref 0–53)
AST: 19 U/L (ref 0–37)
Albumin: 4.4 g/dL (ref 3.5–5.2)
Alkaline Phosphatase: 59 U/L (ref 39–117)
Bilirubin, Direct: 0.3 mg/dL (ref 0.0–0.3)
Total Bilirubin: 1 mg/dL (ref 0.2–1.2)
Total Protein: 7.6 g/dL (ref 6.0–8.3)

## 2024-03-11 LAB — BASIC METABOLIC PANEL WITH GFR
BUN: 24 mg/dL — ABNORMAL HIGH (ref 6–23)
CO2: 28 meq/L (ref 19–32)
Calcium: 9.2 mg/dL (ref 8.4–10.5)
Chloride: 104 meq/L (ref 96–112)
Creatinine, Ser: 0.97 mg/dL (ref 0.40–1.50)
GFR: 72.84 mL/min (ref >60.00)
Glucose, Bld: 174 mg/dL — ABNORMAL HIGH (ref 70–99)
Potassium: 4 meq/L (ref 3.5–5.1)
Sodium: 139 meq/L (ref 135–145)

## 2024-03-11 MED ORDER — TIRZEPATIDE 10 MG/0.5ML ~~LOC~~ SOAJ: 10.0000 mg | SUBCUTANEOUS | 3 refills | Status: AC

## 2024-03-11 NOTE — Assessment & Plan Note (Signed)
 Lab Results  Component Value Date   LDLCALC 41 03/11/2024   Stable, pt to continue current statin crestor  40 mg qd
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
## 2024-03-11 NOTE — Assessment & Plan Note (Addendum)
 Lab Results  Component Value Date   VITAMINB12 308 10/08/2022   Low normal, advised to start oral replacement - b12 1000 mcg qd

## 2024-03-11 NOTE — Assessment & Plan Note (Signed)
 BP Readings from Last 3 Encounters:  03/11/24 118/68  02/10/24 110/60  01/01/24 118/64   Stable, pt to continue medical treatment vasotec  5 mg qd

## 2024-03-11 NOTE — Assessment & Plan Note (Signed)
 Lab Results  Component Value Date   HGBA1C 7.5 (H) 03/11/2024   Uncontrolled,, pt to increase the mounjaro  to 10 mg weekly, cont wt control, and declines to take glipizide , metformin , actos  further for now

## 2024-03-11 NOTE — Patient Instructions (Signed)
 The Mychart sign up message was texted to your phone  Ok to increase the Mounjaro  to 10 mg weekly  Please continue all other medications as before, and refills have been done if requested.  Please have the pharmacy call with any other refills you may need.  Please keep your appointments with your specialists as you may have planned  Please go to the LAB at the blood drawing area for the tests to be done - at the [address] [facility] in the basement  You will be contacted by phone if any changes need to be made immediately.  Otherwise, you will receive a letter about your results with an explanation, but please check with MyChart first.  Please make an Appointment to return in 6 months, or sooner if needed

## 2024-03-30 ENCOUNTER — Ambulatory Visit: Payer: Self-pay | Admitting: Cardiology

## 2024-03-30 ENCOUNTER — Ambulatory Visit (HOSPITAL_COMMUNITY)
Admission: RE | Admit: 2024-03-30 | Discharge: 2024-03-30 | Disposition: A | Discharge status: Home / Self Care | Source: Ambulatory Visit | Attending: Cardiology | Admitting: Cardiology

## 2024-03-30 DIAGNOSIS — I4821 Permanent atrial fibrillation: Secondary | ICD-10-CM | POA: Insufficient documentation

## 2024-03-30 LAB — ECHOCARDIOGRAM COMPLETE
Area-P 1/2: 5.23 cm2
MV M vel: 5.12 m/s
MV Peak grad: 104.9 mmHg
S' Lateral: 3.3 cm

## 2024-08-17 ENCOUNTER — Encounter: Payer: Self-pay | Admitting: Pharmacist

## 2024-08-17 NOTE — Progress Notes (Signed)
 Pharmacy Quality Measure Review  This patient is appearing on a report for being at risk of failing the adherence measure for cholesterol (statin) medications this calendar year.   Medication: Rosuvastatin  Last fill date: 07/29/24 for 90 day supply  Insurance report was not up to date. No action needed at this time.   Darrelyn Drum, PharmD, BCPS, CPP Clinical Pharmacist Practitioner [facility] [phone]

## 2024-09-14 ENCOUNTER — Ambulatory Visit: Payer: Self-pay | Admitting: Internal Medicine

## 2024-09-14 ENCOUNTER — Ambulatory Visit: Admitting: Internal Medicine

## 2024-09-14 ENCOUNTER — Encounter: Payer: Self-pay | Admitting: Internal Medicine

## 2024-09-14 VITALS — BP 120/74 | HR 76 | Temp 98.0°F | Ht 76.0 in | Wt 218.0 lb

## 2024-09-14 DIAGNOSIS — I10 Essential (primary) hypertension: Secondary | ICD-10-CM

## 2024-09-14 DIAGNOSIS — Z7985 Long-term (current) use of injectable non-insulin antidiabetic drugs: Secondary | ICD-10-CM | POA: Diagnosis not present

## 2024-09-14 DIAGNOSIS — Z0000 Encounter for general adult medical examination without abnormal findings: Secondary | ICD-10-CM | POA: Diagnosis not present

## 2024-09-14 DIAGNOSIS — E7800 Pure hypercholesterolemia, unspecified: Secondary | ICD-10-CM

## 2024-09-14 DIAGNOSIS — E1165 Type 2 diabetes mellitus with hyperglycemia: Secondary | ICD-10-CM

## 2024-09-14 DIAGNOSIS — E538 Deficiency of other specified B group vitamins: Secondary | ICD-10-CM | POA: Diagnosis not present

## 2024-09-14 DIAGNOSIS — E559 Vitamin D deficiency, unspecified: Secondary | ICD-10-CM

## 2024-09-14 LAB — URINALYSIS, ROUTINE W REFLEX MICROSCOPIC
Bilirubin Urine: NEGATIVE
Hgb urine dipstick: NEGATIVE
Ketones, ur: NEGATIVE
Leukocytes,Ua: NEGATIVE
Nitrite: NEGATIVE
RBC / HPF: NONE SEEN
Specific Gravity, Urine: 1.01 (ref 1.000–1.030)
Total Protein, Urine: NEGATIVE
Urine Glucose: NEGATIVE
Urobilinogen, UA: 0.2 (ref 0.0–1.0)
pH: 6 (ref 5.0–8.0)

## 2024-09-14 LAB — LIPID PANEL
Cholesterol: 87 mg/dL (ref 28–200)
HDL: 39.7 mg/dL
LDL Cholesterol: 36 mg/dL (ref 10–99)
NonHDL: 47.5
Total CHOL/HDL Ratio: 2
Triglycerides: 60 mg/dL (ref 10.0–149.0)
VLDL: 12 mg/dL (ref 0.0–40.0)

## 2024-09-14 LAB — BASIC METABOLIC PANEL WITH GFR
BUN: 23 mg/dL (ref 6–23)
CO2: 29 meq/L (ref 19–32)
Calcium: 9.3 mg/dL (ref 8.4–10.5)
Chloride: 104 meq/L (ref 96–112)
Creatinine, Ser: 0.95 mg/dL (ref 0.40–1.50)
GFR: 74.41 mL/min
Glucose, Bld: 129 mg/dL — ABNORMAL HIGH (ref 70–99)
Potassium: 4.3 meq/L (ref 3.5–5.1)
Sodium: 139 meq/L (ref 135–145)

## 2024-09-14 LAB — HEMOGLOBIN A1C: Hgb A1c MFr Bld: 7.4 % — ABNORMAL HIGH (ref 4.6–6.5)

## 2024-09-14 LAB — CBC WITH DIFFERENTIAL/PLATELET
Basophils Absolute: 0 K/uL (ref 0.0–0.1)
Basophils Relative: 1 % (ref 0.0–3.0)
Eosinophils Absolute: 0.1 K/uL (ref 0.0–0.7)
Eosinophils Relative: 3 % (ref 0.0–5.0)
HCT: 38.1 % — ABNORMAL LOW (ref 39.0–52.0)
Hemoglobin: 12.9 g/dL — ABNORMAL LOW (ref 13.0–17.0)
Lymphocytes Relative: 24 % (ref 12.0–46.0)
Lymphs Abs: 1 K/uL (ref 0.7–4.0)
MCHC: 33.8 g/dL (ref 30.0–36.0)
MCV: 88.9 fl (ref 78.0–100.0)
Monocytes Absolute: 0.4 K/uL (ref 0.1–1.0)
Monocytes Relative: 9.6 % (ref 3.0–12.0)
Neutro Abs: 2.5 K/uL (ref 1.4–7.7)
Neutrophils Relative %: 62.4 % (ref 43.0–77.0)
Platelets: 93 K/uL — ABNORMAL LOW (ref 150.0–400.0)
RBC: 4.29 Mil/uL (ref 4.22–5.81)
RDW: 14.7 % (ref 11.5–15.5)
WBC: 4.1 K/uL (ref 4.0–10.5)

## 2024-09-14 LAB — HEPATIC FUNCTION PANEL
ALT: 12 U/L (ref 3–53)
AST: 20 U/L (ref 5–37)
Albumin: 4.5 g/dL (ref 3.5–5.2)
Alkaline Phosphatase: 65 U/L (ref 39–117)
Bilirubin, Direct: 0.2 mg/dL (ref 0.1–0.3)
Total Bilirubin: 0.9 mg/dL (ref 0.2–1.2)
Total Protein: 7.9 g/dL (ref 6.0–8.3)

## 2024-09-14 LAB — VITAMIN B12: Vitamin B-12: 449 pg/mL (ref 211–911)

## 2024-09-14 LAB — MICROALBUMIN / CREATININE URINE RATIO
Creatinine,U: 59.9 mg/dL
Microalb Creat Ratio: 144.1 mg/g — ABNORMAL HIGH (ref 0.0–30.0)
Microalb, Ur: 8.6 mg/dL — ABNORMAL HIGH (ref 0.7–1.9)

## 2024-09-14 LAB — VITAMIN D 25 HYDROXY (VIT D DEFICIENCY, FRACTURES): VITD: 29.94 ng/mL — ABNORMAL LOW (ref 30.00–100.00)

## 2024-09-14 LAB — TSH: TSH: 2.18 u[IU]/mL (ref 0.35–5.50)

## 2024-09-14 NOTE — Assessment & Plan Note (Signed)
 Lab Results  Component Value Date   LDLCALC 36 09/14/2024   Stable, pt to continue current statin crestor  40 mg qd

## 2024-09-14 NOTE — Assessment & Plan Note (Signed)
 Lab Results  Component Value Date   VITAMINB12 449 09/14/2024   Stable, cont oral replacement - b12 1000 mcg qd

## 2024-09-14 NOTE — Assessment & Plan Note (Addendum)
 With hyperglycemia  Lab Results  Component Value Date   HGBA1C 7.4 (H) 09/14/2024   Stable, pt to continue current medical treatment actos  45 mg every day, mounjaro  10 mg
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
## 2024-09-14 NOTE — Assessment & Plan Note (Signed)
 BP Readings from Last 3 Encounters:  09/14/24 120/74  03/11/24 118/68  02/10/24 110/60   Stable, pt to continue medical treatment vasotec  5 mg qd

## 2024-09-14 NOTE — Assessment & Plan Note (Signed)

## 2024-09-14 NOTE — Patient Instructions (Signed)

## 2025-03-15 ENCOUNTER — Encounter: Admitting: Family Medicine
